# Patient Record
Sex: Male | Born: 1945 | Race: White | Hispanic: No | Marital: Married | Smoking: Former smoker
Health system: Southern US, Community
[De-identification: ages and names within clinical notes are randomized; demographics above are authoritative.]

## PROBLEM LIST (undated history)

## (undated) DIAGNOSIS — I10 Essential (primary) hypertension: Secondary | ICD-10-CM

## (undated) DIAGNOSIS — K219 Gastro-esophageal reflux disease without esophagitis: Secondary | ICD-10-CM

## (undated) DIAGNOSIS — Z87442 Personal history of urinary calculi: Secondary | ICD-10-CM

## (undated) HISTORY — PX: CARPAL TUNNEL RELEASE: SHX101

## (undated) HISTORY — PX: EYE SURGERY: SHX253

## (undated) HISTORY — PX: TONSILLECTOMY: SUR1361

## (undated) HISTORY — PX: BACK SURGERY: SHX140

---

## 2007-09-23 ENCOUNTER — Emergency Department: Payer: Self-pay | Admitting: Emergency Medicine

## 2013-11-17 ENCOUNTER — Observation Stay: Payer: Self-pay | Admitting: Internal Medicine

## 2013-11-17 LAB — CBC WITH DIFFERENTIAL/PLATELET
BASOS ABS: 0 10*3/uL (ref 0.0–0.1)
Basophil %: 0.3 %
Eosinophil #: 0.1 10*3/uL (ref 0.0–0.7)
Eosinophil %: 1.1 %
HCT: 40 % (ref 40.0–52.0)
HGB: 13.5 g/dL (ref 13.0–18.0)
LYMPHS ABS: 1.8 10*3/uL (ref 1.0–3.6)
Lymphocyte %: 26.6 %
MCH: 30.6 pg (ref 26.0–34.0)
MCHC: 33.8 g/dL (ref 32.0–36.0)
MCV: 91 fL (ref 80–100)
Monocyte #: 0.9 x10 3/mm (ref 0.2–1.0)
Monocyte %: 12.7 %
NEUTROS ABS: 4.1 10*3/uL (ref 1.4–6.5)
Neutrophil %: 59.3 %
PLATELETS: 169 10*3/uL (ref 150–440)
RBC: 4.43 10*6/uL (ref 4.40–5.90)
RDW: 13.6 % (ref 11.5–14.5)
WBC: 6.9 10*3/uL (ref 3.8–10.6)

## 2013-11-17 LAB — COMPREHENSIVE METABOLIC PANEL
ALBUMIN: 3.7 g/dL (ref 3.4–5.0)
Alkaline Phosphatase: 111 U/L
Anion Gap: 5 — ABNORMAL LOW (ref 7–16)
BUN: 13 mg/dL (ref 7–18)
Bilirubin,Total: 0.6 mg/dL (ref 0.2–1.0)
CALCIUM: 8.6 mg/dL (ref 8.5–10.1)
CHLORIDE: 106 mmol/L (ref 98–107)
Co2: 29 mmol/L (ref 21–32)
Creatinine: 1.58 mg/dL — ABNORMAL HIGH (ref 0.60–1.30)
EGFR (African American): 52 — ABNORMAL LOW
EGFR (Non-African Amer.): 45 — ABNORMAL LOW
GLUCOSE: 97 mg/dL (ref 65–99)
Osmolality: 279 (ref 275–301)
Potassium: 3.8 mmol/L (ref 3.5–5.1)
SGOT(AST): 56 U/L — ABNORMAL HIGH (ref 15–37)
SGPT (ALT): 66 U/L — ABNORMAL HIGH
Sodium: 140 mmol/L (ref 136–145)
TOTAL PROTEIN: 7.3 g/dL (ref 6.4–8.2)

## 2013-11-18 LAB — BASIC METABOLIC PANEL
Anion Gap: 6 — ABNORMAL LOW (ref 7–16)
BUN: 14 mg/dL (ref 7–18)
CALCIUM: 8 mg/dL — AB (ref 8.5–10.1)
CHLORIDE: 107 mmol/L (ref 98–107)
Co2: 26 mmol/L (ref 21–32)
Creatinine: 1.25 mg/dL (ref 0.60–1.30)
EGFR (African American): >60
EGFR (Non-African Amer.): 59 — ABNORMAL LOW
Glucose: 118 mg/dL — ABNORMAL HIGH (ref 65–99)
OSMOLALITY: 279 (ref 275–301)
POTASSIUM: 4.1 mmol/L (ref 3.5–5.1)
SODIUM: 139 mmol/L (ref 136–145)

## 2013-11-18 LAB — CBC WITH DIFFERENTIAL/PLATELET
Basophil #: 0 10*3/uL (ref 0.0–0.1)
Basophil %: 0.2 %
EOS ABS: 0.1 10*3/uL (ref 0.0–0.7)
Eosinophil %: 1.6 %
HCT: 38.2 % — ABNORMAL LOW (ref 40.0–52.0)
HGB: 13 g/dL (ref 13.0–18.0)
LYMPHS PCT: 24.6 %
Lymphocyte #: 1.7 10*3/uL (ref 1.0–3.6)
MCH: 30.6 pg (ref 26.0–34.0)
MCHC: 33.9 g/dL (ref 32.0–36.0)
MCV: 90 fL (ref 80–100)
Monocyte #: 0.8 x10 3/mm (ref 0.2–1.0)
Monocyte %: 11.9 %
Neutrophil #: 4.4 10*3/uL (ref 1.4–6.5)
Neutrophil %: 61.7 %
PLATELETS: 184 10*3/uL (ref 150–440)
RBC: 4.23 10*6/uL — AB (ref 4.40–5.90)
RDW: 13.4 % (ref 11.5–14.5)
WBC: 7.1 10*3/uL (ref 3.8–10.6)

## 2013-11-22 LAB — CULTURE, BLOOD (SINGLE)

## 2014-07-12 NOTE — Discharge Summary (Signed)
PATIENT NAME:  Dustin EnglishRUDD, Kentaro N MR#:  [id number] DATE OF BIRTH:  1945-08-13  DATE OF ADMISSION:  11/17/2013  DATE OF DISCHARGE:  11/19/2013  CHIEF COMPLAINT ON DATE OF ADMISSION: Right lower extremity redness and rash.   ADMITTING DIAGNOSES: 1.  Acute right lower extremity cellulitis. 2.  Acute renal failure,  3.  Hypertension.   DISCHARGE DIAGNOSES: 1. Acute right lower extremity cellulitis, improved with IV vancomycin. Discharged to home with  doxycycline. 2.  Acute renal insufficiency, probably prerenal, improved with IV fluids.   PROCEDURES: None.   CONSULTATIONS: None.   BRIEF HISTORY OF PRESENT ILLNESS AND HOSPITAL COURSE:   1.  The patient is a 69 year old male, who came into the ED with the chief complaint right lower extremity redness and rash, which has gotten worse the 3 to 4 days prior to the admission. Please review history and physical for details. The patient was admitted to the hospital with right lower extremity cellulitis. The patient failed outpatient therapy with Bactrim and he was started on IV vancomycin. Right lower extremity venous Dopplers were done and DVT was ruled out. With IV vancomycin, his condition significantly improved. Blood cultures were negative. The red streaks tracking up to his groin area were fading away.  The patient started feeling better. Decision is made to discharge the patient home with p.o. doxycycline and probiotics.  2.  Acute renal insufficiency, resolved with IV fluids.  3. Hypertension. Blood pressure is stable. Continue his home medication, atenolol.   CONDITION: Overall condition is stable.   DISCHARGE MEDICATIONS: Atenolol 20 mg once daily, mupirocin 2% topical ointment topically to affected area 3 times a day, Tylenol 325 mg 2 tablets p.o. every 4 hours as needed for pain and temperature, doxycycline 100 mg 1 tablet p.o. 2 times a day for 10 days, probiotic 1 capsule p.o. once daily for 10 days.   DIET: Low-salt.   ACTIVITY: As  tolerated.  Return to work on September 7th.    FOLLOWUP: Follow up with primary care physician in a week.  LABORATORY DATA AND IMAGING STUDIES: Blood cultures are negative x2. Right lower extremity venous Doppler is negative for DVT. WBC 7.1, hemoglobin 13.0, hematocrit is 38.2, platelets are 184. BMP: Sodium, potassium and chloride are normal.  BUN 14, creatinine 1.25 on August 31st.  Calcium is at 8.0, serum osmolality 279.   Plan of care was discussed with the patient. The patient is in agreement with the plan.   TOTAL TIME SPENT ON THE DISCHARGE: Forty-five minutes.    ____________________________ Ramonita LabAruna Merrissa Giacobbe, MD ag:lr D: 11/19/2013 17:23:36 ET T: 11/19/2013 17:48:46 ET JOB#: [id number]  cc: Ramonita LabAruna Suzannah Bettes, MD, <Dictator> Ramonita LabARUNA Chalsey Leeth MD ELECTRONICALLY SIGNED 11/30/2013 15:31 Ramonita LabARUNA Daquana Paddock MD ELECTRONICALLY SIGNED 11/30/2013 16:22

## 2014-07-12 NOTE — H&P (Signed)
PATIENT NAME:  Dustin Reese, [name] MR#:  [id number] DATE OF BIRTH:  1945/09/13  DATE OF ADMISSION:  11/17/2013  PRIMARY CARE [facility]YSICIAN[facility].  CHIEF COMPLAINT: Right lower extremity redness and rash.   HISTORY OF PRESENT ILLNESS: This is a 69 year old male who presents to the hospital with a right lower extremity redness and rash that has progressively gotten worse over the past 3-4 days. The patient says it started out originally with a small lesion and has progressively gotten worse in the past 2 to 3 days. He did say that he had chills and rigors about 2 days ago, but since then no fever. He did not actually record his fever when he had the rigors 2 days back. He also admits to some mild nausea but no acute vomiting. Since his rash was looking worse he actually went to see his primary care physician. They started him on some Bactrim and an ointment. The patient was applying the ointment and taking the Bactrim for the past few days but his rash has not improved. He, therefore, came to the ER for further evaluation. In the Emergency Room, the patient was diagnosed with possible acute right lower extremity cellulitis and the hospitalist services were contacted for further treatment and evaluation.   REVIEW OF SYSTEMS: CONSTITUTIONAL: No documented fever. No weight gain, no weight loss.  EYES: No blurry or double vision.  ENT: No tinnitus. No postnasal drip. No redness of the oropharynx.  RESPIRATORY: No cough. No wheeze. No hemoptysis. No dyspnea.  CARDIOVASCULAR: No chest pain, no orthopnea, no palpitations, no syncope.  GASTROINTESTINAL: Positive nausea. No vomiting. No diarrhea or abdominal pain. No melena or hematochezia.  GENITOURINARY: No dysuria or hematuria.  ENDOCRINE: No polyuria or nocturia. No heat or cold intolerance.  HEMATOLOGIC: No anemia, no bruising, no bleeding.  INTEGUMENTARY: No rashes. No lesions.  MUSCULOSKELETAL: No arthritis, no swelling, no gout.   NEUROLOGIC: No numbness or tingling. No ataxia. No seizure activity.  PSYCHIATRIC: No anxiety. No insomnia. No ADD.   PAST MEDICAL HISTORY: Consistent with hypertension.   ALLERGIES: No known drug allergies.   SOCIAL HISTORY: No smoking. No alcohol abuse. No illicit drug abuse. Lives by himself.   FAMILY HISTORY: Mother and father are both deceased. Mother died at age 69. She was diabetic. Father died at age 69 from a heart attack.   CURRENT MEDICATIONS: As follows: Atenolol 25 mg daily, mupirocin topical 2% cream to be applied 3 times a day and Bactrim double strength 1 tab b.i.d.   PHYSICAL EXAMINATION: Presently is as follows:  VITALS SIGNS: Temperature is 97.8, pulse 56, respirations 18, blood pressure 136/84. Sats 100% on room air.  GENERAL: He is a pleasant-appearing male in no apparent distress.  HEAD, EYES, EARS, NOSE, AND THROAT: Atraumatic, normocephalic. Extraocular muscles are intact. Pupils equal and reactive on to light. Sclerae is anicteric. No conjunctival injection. No pharyngeal erythema.  NECK: Supple. There is no jugular venous distention. No bruits, no lymphadenopathy, no thyromegaly.  HEART: Regular rate and rhythm. No murmurs, no rubs, no clicks.  LUNGS: Clear to auscultation bilaterally. No rales or rhonchi. No wheezes.  ABDOMEN: Soft, flat, nontender, nondistended. Has good bowel sounds. No hepatosplenomegaly appreciated.  EXTREMITIES: No evidence of any cyanosis, clubbing, or peripheral edema. Has +2 pedal and radial pulses bilaterally.  NEUROLOGIC: The patient is alert, awake, and oriented x 3 with no focal motor or sensory deficits appreciated bilaterally.  SKIN: Moist and warm. The patient does have a cellulitic  and angry-appearing rash on his right lower extremity with some blisters with no drainage. It is not warm to touch. It is not painful to touch. He does have a lymphatic streak going up from the rash itself to his right inguinal area but no lymph nodes  palpated.  LYMPHATIC: There is no cervical or axillary lymphadenopathy.   LABORATORY DATA: Serum glucose of 97, BUN 13, creatinine 1.5, sodium 140, potassium 3.8, chloride 106, bicarbonate 29. LFTs showed an AST of 56, ALT of 66. White cell count 6.9, hemoglobin 13.5, hematocrit 40.0, platelet count of 169,000.   ASSESSMENT AND PLAN: This is a 69 year old male with a history of hypertension who presented to the hospital due to right lower extremity redness and rash progressively getting worse treated as an outpatient with oral Bactrim and mupirocin ointment and has failed outpatient therapy.   PROBLEMS: 1. Acute right lower extremity cellulitis. This is a presumed diagnosis, given his clinical symptoms. The patient has failed outpatient oral antibiotic therapy with Bactrim and mupirocin. I will start the patient on IV vancomycin and follow him clinically and follow blood cultures. The cellulitic rash is usually painful and warm to touch, although that is not the case for this patient. He is also afebrile with no white cell count. I will get an ultrasound of the right lower extremity to rule out deep vein thrombosis. If the patient's clinical symptoms are not improving in 24-48 hours, he may benefit from a skin punch biopsy.  2. Hypertension. Continue atenolol. 3. Acute renal failure, likely prerenal in nature. Will hydrate with intravenous fluids and follow BUN and creatinine.   CODE STATUS: The patient is a full code.   TIME SPENT: 50 minutes.   ____________________________ Rolly Pancake. Cherlynn Kaiser, MD vjs:lt D: 11/17/2013 15:44:14 ET T: 11/17/2013 16:51:01 ET JOB#: [id number]  cc: Rolly Pancake. Cherlynn Kaiser, MD, <Dictator> Houston Siren MD ELECTRONICALLY SIGNED 11/27/2013 15:48

## 2017-12-22 ENCOUNTER — Observation Stay
Admission: EM | Admit: 2017-12-22 | Discharge: 2017-12-23 | Disposition: A | Discharge status: Home / Self Care | Payer: Medicare Other | Attending: Internal Medicine | Admitting: Internal Medicine

## 2017-12-22 ENCOUNTER — Other Ambulatory Visit: Payer: Self-pay

## 2017-12-22 ENCOUNTER — Emergency Department: Payer: Medicare Other

## 2017-12-22 DIAGNOSIS — K5730 Diverticulosis of large intestine without perforation or abscess without bleeding: Secondary | ICD-10-CM | POA: Insufficient documentation

## 2017-12-22 DIAGNOSIS — R110 Nausea: Secondary | ICD-10-CM | POA: Diagnosis not present

## 2017-12-22 DIAGNOSIS — N200 Calculus of kidney: Secondary | ICD-10-CM | POA: Diagnosis present

## 2017-12-22 DIAGNOSIS — J449 Chronic obstructive pulmonary disease, unspecified: Secondary | ICD-10-CM | POA: Diagnosis not present

## 2017-12-22 DIAGNOSIS — Z87442 Personal history of urinary calculi: Secondary | ICD-10-CM | POA: Insufficient documentation

## 2017-12-22 DIAGNOSIS — I10 Essential (primary) hypertension: Secondary | ICD-10-CM | POA: Insufficient documentation

## 2017-12-22 DIAGNOSIS — Z79899 Other long term (current) drug therapy: Secondary | ICD-10-CM | POA: Diagnosis not present

## 2017-12-22 DIAGNOSIS — N132 Hydronephrosis with renal and ureteral calculous obstruction: Secondary | ICD-10-CM | POA: Diagnosis not present

## 2017-12-22 DIAGNOSIS — Z87891 Personal history of nicotine dependence: Secondary | ICD-10-CM | POA: Insufficient documentation

## 2017-12-22 DIAGNOSIS — R1031 Right lower quadrant pain: Secondary | ICD-10-CM | POA: Diagnosis not present

## 2017-12-22 DIAGNOSIS — I700 Atherosclerosis of aorta: Secondary | ICD-10-CM | POA: Insufficient documentation

## 2017-12-22 DIAGNOSIS — M419 Scoliosis, unspecified: Secondary | ICD-10-CM | POA: Diagnosis not present

## 2017-12-22 DIAGNOSIS — N201 Calculus of ureter: Secondary | ICD-10-CM

## 2017-12-22 HISTORY — DX: Essential (primary) hypertension: I10

## 2017-12-22 LAB — COMPREHENSIVE METABOLIC PANEL
ALBUMIN: 4.5 g/dL (ref 3.5–5.0)
ALK PHOS: 137 U/L — AB (ref 38–126)
ALT: 29 U/L (ref 0–44)
AST: 34 U/L (ref 15–41)
Anion gap: 8 (ref 5–15)
BILIRUBIN TOTAL: 0.9 mg/dL (ref 0.3–1.2)
BUN: 22 mg/dL (ref 8–23)
CALCIUM: 9.2 mg/dL (ref 8.9–10.3)
CO2: 27 mmol/L (ref 22–32)
Chloride: 108 mmol/L (ref 98–111)
Creatinine, Ser: 1.6 mg/dL — ABNORMAL HIGH (ref 0.61–1.24)
GFR calc Af Amer: 48 mL/min — ABNORMAL LOW (ref >60)
GFR calc non Af Amer: 42 mL/min — ABNORMAL LOW (ref >60)
GLUCOSE: 139 mg/dL — AB (ref 70–99)
Potassium: 4.1 mmol/L (ref 3.5–5.1)
SODIUM: 143 mmol/L (ref 135–145)
TOTAL PROTEIN: 7.5 g/dL (ref 6.5–8.1)

## 2017-12-22 LAB — URINALYSIS, COMPLETE (UACMP) WITH MICROSCOPIC
BACTERIA UA: NONE SEEN
BILIRUBIN URINE: NEGATIVE
Glucose, UA: NEGATIVE mg/dL
KETONES UR: NEGATIVE mg/dL
Leukocytes, UA: NEGATIVE
Nitrite: NEGATIVE
PH: 7 (ref 5.0–8.0)
Protein, ur: NEGATIVE mg/dL
SQUAMOUS EPITHELIAL / LPF: NONE SEEN (ref 0–5)
Specific Gravity, Urine: 1.015 (ref 1.005–1.030)

## 2017-12-22 LAB — CBC WITH DIFFERENTIAL/PLATELET
Basophils Absolute: 0 10*3/uL (ref 0–0.1)
Basophils Relative: 0 %
Eosinophils Absolute: 0.2 10*3/uL (ref 0–0.7)
Eosinophils Relative: 2 %
HEMATOCRIT: 42.4 % (ref 40.0–52.0)
Hemoglobin: 15.2 g/dL (ref 13.0–18.0)
LYMPHS PCT: 31 %
Lymphs Abs: 2.9 10*3/uL (ref 1.0–3.6)
MCH: 31.7 pg (ref 26.0–34.0)
MCHC: 35.9 g/dL (ref 32.0–36.0)
MCV: 88.1 fL (ref 80.0–100.0)
MONO ABS: 1 10*3/uL (ref 0.2–1.0)
MONOS PCT: 10 %
NEUTROS ABS: 5.4 10*3/uL (ref 1.4–6.5)
Neutrophils Relative %: 57 %
Platelets: 190 10*3/uL (ref 150–440)
RBC: 4.81 MIL/uL (ref 4.40–5.90)
RDW: 13.7 % (ref 11.5–14.5)
WBC: 9.6 10*3/uL (ref 3.8–10.6)

## 2017-12-22 MED ORDER — PROMETHAZINE HCL 25 MG/ML IJ SOLN
12.5000 mg | Freq: Once | INTRAMUSCULAR | Status: AC
  Administered 2017-12-22: 12.5 mg via INTRAVENOUS
  Filled 2017-12-22: qty 1

## 2017-12-22 MED ORDER — ONDANSETRON HCL 4 MG/2ML IJ SOLN
INTRAMUSCULAR | Status: AC
  Administered 2017-12-22: 4 mg via INTRAVENOUS
  Filled 2017-12-22: qty 2

## 2017-12-22 MED ORDER — POLYETHYLENE GLYCOL 3350 17 G PO PACK: 17.0000 g | PACK | Freq: Every day | ORAL | Status: DC | PRN

## 2017-12-22 MED ORDER — MORPHINE SULFATE (PF) 4 MG/ML IV SOLN
INTRAVENOUS | Status: AC
  Administered 2017-12-22: 4 mg via INTRAVENOUS
  Filled 2017-12-22: qty 1

## 2017-12-22 MED ORDER — OXYCODONE-ACETAMINOPHEN 10-325 MG PO TABS: 1.0000 | ORAL_TABLET | Freq: Four times a day (QID) | ORAL | 0 refills | Status: DC | PRN

## 2017-12-22 MED ORDER — TAMSULOSIN HCL 0.4 MG PO CAPS
0.4000 mg | ORAL_CAPSULE | Freq: Every day | ORAL | Status: DC
  Administered 2017-12-23: 0.4 mg via ORAL
  Filled 2017-12-22: qty 1

## 2017-12-22 MED ORDER — HYDROCODONE-ACETAMINOPHEN 5-325 MG PO TABS
1.0000 | ORAL_TABLET | ORAL | Status: DC | PRN
  Administered 2017-12-22: 1 via ORAL
  Administered 2017-12-22: 2 via ORAL
  Filled 2017-12-22: qty 2
  Filled 2017-12-22: qty 1
  Filled 2017-12-22: qty 2

## 2017-12-22 MED ORDER — HEPARIN SODIUM (PORCINE) 5000 UNIT/ML IJ SOLN
5000.0000 [IU] | Freq: Three times a day (TID) | INTRAMUSCULAR | Status: DC
  Administered 2017-12-22: 5000 [IU] via SUBCUTANEOUS
  Filled 2017-12-22 (×3): qty 1

## 2017-12-22 MED ORDER — TEMAZEPAM 15 MG PO CAPS
15.0000 mg | ORAL_CAPSULE | Freq: Every day | ORAL | Status: DC
  Administered 2017-12-22: 15 mg via ORAL
  Filled 2017-12-22: qty 1

## 2017-12-22 MED ORDER — ACETAMINOPHEN 325 MG PO TABS: 650.0000 mg | ORAL_TABLET | Freq: Four times a day (QID) | ORAL | Status: DC | PRN

## 2017-12-22 MED ORDER — KETOROLAC TROMETHAMINE 30 MG/ML IJ SOLN
15.0000 mg | Freq: Four times a day (QID) | INTRAMUSCULAR | Status: DC | PRN
  Administered 2017-12-23 (×2): 15 mg via INTRAVENOUS
  Filled 2017-12-22 (×2): qty 1

## 2017-12-22 MED ORDER — HYDROMORPHONE HCL 1 MG/ML IJ SOLN
INTRAMUSCULAR | Status: AC
  Administered 2017-12-22: 1 mg via INTRAVENOUS
  Filled 2017-12-22: qty 1

## 2017-12-22 MED ORDER — KETOROLAC TROMETHAMINE 30 MG/ML IJ SOLN
30.0000 mg | Freq: Once | INTRAMUSCULAR | Status: AC
  Administered 2017-12-22: 30 mg via INTRAVENOUS
  Filled 2017-12-22: qty 1

## 2017-12-22 MED ORDER — ONDANSETRON 4 MG PO TBDP: 4.0000 mg | ORAL_TABLET | Freq: Three times a day (TID) | ORAL | 0 refills | Status: DC | PRN

## 2017-12-22 MED ORDER — MORPHINE SULFATE (PF) 4 MG/ML IV SOLN
4.0000 mg | Freq: Once | INTRAVENOUS | Status: AC
  Administered 2017-12-22: 4 mg via INTRAVENOUS
  Filled 2017-12-22: qty 1

## 2017-12-22 MED ORDER — ATENOLOL 25 MG PO TABS
25.0000 mg | ORAL_TABLET | Freq: Every day | ORAL | Status: DC
  Administered 2017-12-22 – 2017-12-23 (×2): 25 mg via ORAL
  Filled 2017-12-22 (×2): qty 1

## 2017-12-22 MED ORDER — METOCLOPRAMIDE HCL 5 MG/ML IJ SOLN
10.0000 mg | Freq: Once | INTRAMUSCULAR | Status: AC
  Administered 2017-12-22: 10 mg via INTRAVENOUS
  Filled 2017-12-22: qty 2

## 2017-12-22 MED ORDER — MORPHINE SULFATE (PF) 4 MG/ML IV SOLN
4.0000 mg | Freq: Once | INTRAVENOUS | Status: AC
  Administered 2017-12-22: 4 mg via INTRAVENOUS

## 2017-12-22 MED ORDER — SODIUM CHLORIDE 0.9 % IV SOLN
INTRAVENOUS | Status: DC
  Administered 2017-12-22 – 2017-12-23 (×3): via INTRAVENOUS

## 2017-12-22 MED ORDER — ACETAMINOPHEN 650 MG RE SUPP: 650.0000 mg | Freq: Four times a day (QID) | RECTAL | Status: DC | PRN

## 2017-12-22 MED ORDER — HYDROMORPHONE HCL 1 MG/ML IJ SOLN
1.0000 mg | Freq: Once | INTRAMUSCULAR | Status: AC
  Administered 2017-12-22: 1 mg via INTRAVENOUS

## 2017-12-22 MED ORDER — HYDRALAZINE HCL 20 MG/ML IJ SOLN
10.0000 mg | Freq: Four times a day (QID) | INTRAMUSCULAR | Status: DC | PRN
  Administered 2017-12-22: 10 mg via INTRAVENOUS
  Filled 2017-12-22: qty 1

## 2017-12-22 MED ORDER — TAMSULOSIN HCL 0.4 MG PO CAPS
0.4000 mg | ORAL_CAPSULE | Freq: Once | ORAL | Status: AC
  Administered 2017-12-22: 0.4 mg via ORAL
  Filled 2017-12-22: qty 1

## 2017-12-22 MED ORDER — SODIUM CHLORIDE 0.9 % IV BOLUS
1000.0000 mL | Freq: Once | INTRAVENOUS | Status: AC
  Administered 2017-12-22: 1000 mL via INTRAVENOUS

## 2017-12-22 MED ORDER — ONDANSETRON HCL 4 MG/2ML IJ SOLN
4.0000 mg | Freq: Once | INTRAMUSCULAR | Status: AC
  Administered 2017-12-22: 4 mg via INTRAVENOUS

## 2017-12-22 MED ORDER — TAMSULOSIN HCL 0.4 MG PO CAPS: 0.4000 mg | ORAL_CAPSULE | Freq: Every day | ORAL | 0 refills | Status: DC

## 2017-12-22 NOTE — ED Notes (Signed)
Paged Dr. Allena Katz who returned call to this RN. Informed of urologist recommendations. Pt scared to go home d/t pain. Pt is urinating on own and eating at this time. Dr. Allena Katz informed. States continue with admission process.

## 2017-12-22 NOTE — ED Notes (Signed)
Urologist, Dr. Mena Goes, called this RN stating that pt does not need stent placement. Believes pt could go home if feeling better or have observation with pain control over night and have procedure tomorrow. Informed this RN to update admitting MD, Dr. Allena Katz.

## 2017-12-22 NOTE — ED Notes (Signed)
Pt ambulatory to toilet by self. Denies dizziness.

## 2017-12-22 NOTE — ED Provider Notes (Signed)
[facility] Emergency Department Provider Note    First MD Initiated Contact with Patient 12/22/17 [phone]     (approximate)  I have reviewed the triage vital signs and the nursing notes.   HISTORY  Chief Complaint Flank Pain    HPI Dustin Reese is a 72 y.o. male resents to the emergency department with acute onset of right flank pain which began yesterday with acute worsening tonight.  Patient also admits to nausea.  Patient denies any fever hematuria.  Patient denies any diarrhea or constipation.  Patient states current pain score is 10 out of 10.     Past medical history Kidney stone There are no active problems to display for this patient.     Prior to Admission medications   Medication Sig Start Date End Date Taking? Authorizing Provider  atenolol (TENORMIN) 25 MG tablet Take 25 mg by mouth daily. 09/28/17   [provider]  ondansetron (ZOFRAN ODT) 4 MG disintegrating tablet Take 1 tablet (4 mg total) by mouth every 8 (eight) hours as needed for nausea or vomiting. 12/22/17   Darci Current, MD  oxyCODONE-acetaminophen (PERCOCET) 10-325 MG tablet Take 1 tablet by mouth every 6 (six) hours as needed for pain. 12/22/17 12/22/18  Darci Current, MD  tamsulosin (FLOMAX) 0.4 MG CAPS capsule Take 1 capsule (0.4 mg total) by mouth daily after breakfast for 7 days. 12/22/17 12/29/17  Darci Current, MD  temazepam (RESTORIL) 15 MG capsule Take 15 mg by mouth at bedtime. 12/18/17   [provider]    Allergies No known drug allergies History reviewed. No pertinent family history.  Social History Social History   Tobacco Use  . Smoking status: Not on file  Substance Use Topics  . Alcohol use: Not on file  . Drug use: Not on file    Review of Systems Constitutional: No fever/chills Eyes: No visual changes. ENT: No sore throat. Cardiovascular: Denies chest pain. Respiratory: Denies shortness of breath. Gastrointestinal:  Positive for right flank pain and nausea.  No diarrhea.  No constipation. Genitourinary: Negative for dysuria. Musculoskeletal: Negative for neck pain.  Negative for back pain. Integumentary: Negative for rash. Neurological: Negative for headaches, focal weakness or numbness.   ____________________________________________   PHYSICAL EXAM:  VITAL SIGNS: ED Triage Vitals  Enc Vitals Group     BP 12/22/17 0424 (!) 140/99     Pulse Rate 12/22/17 0424 77     Resp 12/22/17 0424 18     Temp 12/22/17 0424 97.6 F (36.4 C)     Temp Source 12/22/17 0424 Oral     SpO2 12/22/17 0424 100 %     Weight --      Height --      Head Circumference --      Peak Flow --      Pain Score 12/22/17 0414 10     Pain Loc --      Pain Edu? --      Excl. in GC? --     Constitutional: Alert and oriented.  Apparent discomfort Eyes: Conjunctivae are normal.  Mouth/Throat: Mucous membranes are moist.  Oropharynx non-erythematous. Neck: No stridor.   Cardiovascular: Normal rate, regular rhythm. Good peripheral circulation. Grossly normal heart sounds. Respiratory: Normal respiratory effort.  No retractions. Lungs CTAB. Gastrointestinal: Soft and nontender. No distention.  Musculoskeletal: No lower extremity tenderness nor edema. No gross deformities of extremities. Neurologic:  Normal speech and language. No gross focal neurologic deficits are appreciated.  Skin:  Skin is warm, dry and intact. No rash noted. Psychiatric: Mood and affect are normal. Speech and behavior are normal.  ____________________________________________   LABS (all labs ordered are listed, but only abnormal results are displayed)  Labs Reviewed  COMPREHENSIVE METABOLIC PANEL - Abnormal; Notable for the following components:      Result Value   Glucose, Bld 139 (*)    Creatinine, Ser 1.60 (*)    Alkaline Phosphatase 137 (*)    GFR calc non Af Amer 42 (*)    GFR calc Af Amer 48 (*)    All other components within normal  limits  URINALYSIS, COMPLETE (UACMP) WITH MICROSCOPIC - Abnormal; Notable for the following components:   Color, Urine YELLOW (*)    APPearance CLEAR (*)    Hgb urine dipstick MODERATE (*)    All other components within normal limits  CBC WITH DIFFERENTIAL/PLATELET   _______________  RADIOLOGY I, Buford Dewayne Shorter, personally viewed and evaluated these images (plain radiographs) as part of my medical decision making, as well as reviewing the written report by the radiologist.  ED MD interpretation: 5 mm obstructing right urethral stone with associated hydronephrosis.  Official radiology report(s): Ct Renal Stone Study  Result Date: 12/22/2017 CLINICAL DATA:  Right flank pain.  History of kidney stones. EXAM: CT ABDOMEN AND PELVIS WITHOUT CONTRAST TECHNIQUE: Multidetector CT imaging of the abdomen and pelvis was performed following the standard protocol without IV contrast. COMPARISON:  CT 09/23/2007 FINDINGS: Lower chest: Unchanged clustered nodules in the right middle lobe, stability over 10 years consistent with benign process. Hepatobiliary: No focal liver abnormality is seen. No gallstones, gallbladder wall thickening, or biliary dilatation. Pancreas: No ductal dilatation or inflammation. Spleen: Normal in size without focal abnormality. Adrenals/Urinary Tract: Normal adrenal glands. Obstructing 5 x 5 mm stone in the right mid ureter with moderate proximal hydroureteronephrosis and perinephric edema. 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 Two nonobstructing stones in the left kidney. No left hydronephrosis. Left ureter is decompressed. Urinary bladder is physiologically distended. No bladder wall thickening. No bladder stone. Stomach/Bowel: Moderate diverticulosis of the descending colon. No diverticulitis. No bowel wall thickening, inflammatory change or obstruction. Normal appendix. Stomach is nondistended. Vascular/Lymphatic: Aortic atherosclerosis and  tortuosity. No aneurysm. No enlarged abdominal or pelvic lymph nodes. Reproductive: Prostate is unremarkable. Other: Multiple pelvic phleboliths.  No free air or free fluid. Musculoskeletal: Scoliosis and degenerative change in the lumbar spine. Modic endplate changes at L3-L4 and L4-L5. There are no acute or suspicious osseous abnormalities. IMPRESSION: 1. Obstructing 5 mm stone in the right mid ureter with moderate hydronephrosis. 2. Bilateral nonobstructing nephrolithiasis. 3.  Aortic Atherosclerosis (ICD10-I70.0). Electronically Signed   By: Narda Rutherford M.D.   On: 12/22/2017 05:15     Procedures   ____________________________________________   INITIAL IMPRESSION / ASSESSMENT AND PLAN / ED COURSE  As part of my medical decision making, I reviewed the following data within the electronic MEDICAL RECORD NUMBER   72 year old male presenting with above-stated history and physical exam secondary to right flank pain.  Concern for possible kidney stone as such CT was performed revealed a 5 mm right ureteral stone with hydronephrosis.  Patient's urinalysis revealed no evidence of UTI.  Patient received IV morphine with minimal pain improvement and as such patient was given Dilaudid pain is controlled at this time.  Patient given Zofran however nausea persists and as such she received Reglan and Phenergan.  Patient will be referred to urology  for further outpatient evaluation following pain control and nausea control ____________________________________________  FINAL CLINICAL IMPRESSION(S) / ED DIAGNOSES  Final diagnoses:  Right kidney stone     MEDICATIONS GIVEN DURING THIS VISIT:  Medications  ketorolac (TORADOL) 30 MG/ML injection 30 mg (has no administration in time range)  tamsulosin (FLOMAX) capsule 0.4 mg (has no administration in time range)  morphine 4 MG/ML injection 4 mg (4 mg Intravenous Given 12/22/17 0423)  ondansetron (ZOFRAN) injection 4 mg (4 mg Intravenous Given 12/22/17  0423)  sodium chloride 0.9 % bolus 1,000 mL (0 mLs Intravenous Stopped 12/22/17 0606)  morphine 4 MG/ML injection 4 mg (4 mg Intravenous Given 12/22/17 0427)  HYDROmorphone (DILAUDID) injection 1 mg (1 mg Intravenous Given 12/22/17 0434)  ondansetron (ZOFRAN) injection 4 mg (4 mg Intravenous Given 12/22/17 0503)  metoCLOPramide (REGLAN) injection 10 mg (10 mg Intravenous Given 12/22/17 0539)  promethazine (PHENERGAN) injection 12.5 mg (12.5 mg Intravenous Given 12/22/17 0643)     ED Discharge Orders         Ordered    oxyCODONE-acetaminophen (PERCOCET) 10-325 MG tablet  Every 6 hours PRN     12/22/17 0652    ondansetron (ZOFRAN ODT) 4 MG disintegrating tablet  Every 8 hours PRN     12/22/17 0652    tamsulosin (FLOMAX) 0.4 MG CAPS capsule  Daily after breakfast     12/22/17 4098           Note:  This document was prepared using Dragon voice recognition software and may include unintentional dictation errors.    Darci Current, MD 12/22/17 0700

## 2017-12-22 NOTE — Progress Notes (Signed)
Family Meeting Note  Patient being admitted with right made ureteral stone and bilateral nephrology passes not obstructing. He has history of hypertension. Otherwise quite healthy and functional. Code status discussed patient wants to be full code. No family in the room. Time spent during discussion: 16 mins  Enedina Finner, MD

## 2017-12-22 NOTE — Consult Note (Signed)
Consult patient: Right ureteral stone Requested by: Dr. Bayard Males  History of Present Illness: 72 year old white male who developed right flank pain yesterday and was so severe he came to emergency last night.  His creatinine bumped slightly to 1.6.  No white count.  UA showed no bacteria, 0-5 white cells and 6-10 red cells.  He continues to have significant pain tonight in the right flank.  He has been voiding and straining the urine but has not seen a stone pass.  He has some nausea.  He passed a stone several years ago but has never needed surgery for stones.  There is also a 5 mm stone in the right midpole on CT.  Past Medical History:  Diagnosis Date  . HTN (hypertension)      Home Medications:  Medications Prior to Admission  Medication Sig Dispense Refill Last Dose  . atenolol (TENORMIN) 25 MG tablet Take 25 mg by mouth daily.  0 12/21/2017 at AM  . temazepam (RESTORIL) 15 MG capsule Take 15 mg by mouth at bedtime.  2 12/21/2017 at PM   Allergies: No Known Allergies  History reviewed. No pertinent family history. Social History:  has no tobacco, alcohol, and drug history on file.  ROS: A complete review of systems was performed.  All systems are negative except for pertinent findings as noted. Review of Systems  All other systems reviewed and are negative.    Physical Exam:  Vital signs in last 24 hours: Temp:  [97.6 F (36.4 C)-98.8 F (37.1 C)] 98 F (36.7 C) (10/04 1937) Pulse Rate:  [64-83] 79 (10/04 1937) Resp:  [12-18] 16 (10/04 1937) BP: (124-186)/(69-101) 186/101 (10/04 1937) SpO2:  [91 %-100 %] 100 % (10/04 1937) General:  Alert and oriented, No acute distress, sitting on the edge of the bed.  Looks a little bit uncomfortable. HEENT: Normocephalic, atraumatic Cardiovascular: Regular rate and rhythm Lungs: Regular rate and effort Abdomen: Soft, nontender, nondistended, no abdominal masses Back: No CVA tenderness Extremities: No edema Neurologic:  Grossly intact  Laboratory Data:  Results for orders placed or performed during the hospital encounter of 12/22/17 (from the past 24 hour(s))  CBC with Differential     Status: None   Collection Time: 12/22/17  4:18 AM  Result Value Ref Range   WBC 9.6 3.8 - 10.6 K/uL   RBC 4.81 4.40 - 5.90 MIL/uL   Hemoglobin 15.2 13.0 - 18.0 g/dL   HCT 46.9 62.9 - 52.8 %   MCV 88.1 80.0 - 100.0 fL   MCH 31.7 26.0 - 34.0 pg   MCHC 35.9 32.0 - 36.0 g/dL   RDW 41.3 24.4 - 01.0 %   Platelets 190 150 - 440 K/uL   Neutrophils Relative % 57 %   Neutro Abs 5.4 1.4 - 6.5 K/uL   Lymphocytes Relative 31 %   Lymphs Abs 2.9 1.0 - 3.6 K/uL   Monocytes Relative 10 %   Monocytes Absolute 1.0 0.2 - 1.0 K/uL   Eosinophils Relative 2 %   Eosinophils Absolute 0.2 0 - 0.7 K/uL   Basophils Relative 0 %   Basophils Absolute 0.0 0 - 0.1 K/uL  Comprehensive metabolic panel     Status: Abnormal   Collection Time: 12/22/17  4:18 AM  Result Value Ref Range   Sodium 143 135 - 145 mmol/L   Potassium 4.1 3.5 - 5.1 mmol/L   Chloride 108 98 - 111 mmol/L   CO2 27 22 - 32 mmol/L   Glucose, Bld 139 (  H) 70 - 99 mg/dL   BUN 22 8 - 23 mg/dL   Creatinine, Ser 1.61 (H) 0.61 - 1.24 mg/dL   Calcium 9.2 8.9 - 09.6 mg/dL   Total Protein 7.5 6.5 - 8.1 g/dL   Albumin 4.5 3.5 - 5.0 g/dL   AST 34 15 - 41 U/L   ALT 29 0 - 44 U/L   Alkaline Phosphatase 137 (H) 38 - 126 U/L   Total Bilirubin 0.9 0.3 - 1.2 mg/dL   GFR calc non Af Amer 42 (L) >60 mL/min   GFR calc Af Amer 48 (L) >60 mL/min   Anion gap 8 5 - 15  Urinalysis, Complete w Microscopic     Status: Abnormal   Collection Time: 12/22/17  6:06 AM  Result Value Ref Range   Color, Urine YELLOW (A) YELLOW   APPearance CLEAR (A) CLEAR   Specific Gravity, Urine 1.015 1.005 - 1.030   pH 7.0 5.0 - 8.0   Glucose, UA NEGATIVE NEGATIVE mg/dL   Hgb urine dipstick MODERATE (A) NEGATIVE   Bilirubin Urine NEGATIVE NEGATIVE   Ketones, ur NEGATIVE NEGATIVE mg/dL   Protein, ur NEGATIVE  NEGATIVE mg/dL   Nitrite NEGATIVE NEGATIVE   Leukocytes, UA NEGATIVE NEGATIVE   RBC / HPF 6-10 0 - 5 RBC/hpf   WBC, UA 0-5 0 - 5 WBC/hpf   Bacteria, UA NONE SEEN NONE SEEN   Squamous Epithelial / LPF NONE SEEN 0 - 5   Mucus PRESENT    No results found for this or any previous visit (from the past 240 hour(s)). Creatinine: Recent Labs    12/22/17 0418  CREATININE 1.60*    Impression/Assessment:  Right renal and right ureteral stone- Plan:  I discussed with the patient the nature, potential benefits, risks and alternatives to cystoscopy, right retrograde, right ureteral stent with ureteroscopy possible laser lithotripsy, including side effects of the proposed treatment, the likelihood of the patient achieving the goals of the procedure, and any potential problems that might occur during the procedure or recuperation. Also discussed continued stone passage and f/u in outpatient to consider ESWL. I drew patient a picture of the anatomy. All questions answered. We'll see how he does overnight. If he continues to have pain we'll take him to OR tomorrow. NPO p MN.     Jerilee Field 12/22/2017, 8:24 PM

## 2017-12-22 NOTE — ED Notes (Signed)
Pt sitting up eating lunch tray

## 2017-12-22 NOTE — ED Notes (Signed)
Dr. Brown at bedside

## 2017-12-22 NOTE — ED Notes (Signed)
Pt transported to  203 

## 2017-12-22 NOTE — H&P (Addendum)
[facility] - [place] at [facility]   PATIENT NAME: Dustin Reese    MR#:  [id number]  DATE OF BIRTH:  12-Jan-1946  DATE OF ADMISSION:  12/22/2017  PRIMARY CARE PHYSICIAN: System, Pcp Not In   REQUESTING/REFERRING PHYSICIAN: Dr. Manson Passey  CHIEF COMPLAINT:  abdominal pain nausea and vomiting  HISTORY OF PRESENT ILLNESS:  Dustin Reese  is a 72 y.o. male with a known history of hypertension and a kidney stone about 10 years ago comes to the emergency room with significant back pain nausea and vomiting. Patient was found to have bright rights ureteral stone about 5 mm along with hydronephrosis. ER physician spoke with Dr. Mena Goes. Patient is being admitted for further evaluation of management. Patient feels his pain is under control. He is a bit anxious about the stone   PAST MEDICAL HISTORY:   Past Medical History:  Diagnosis Date  . HTN (hypertension)     PAST SURGICAL HISTOIRY:    SOCIAL HISTORY:   Social History   Tobacco Use  . Smoking status: Not on file  Substance Use Topics  . Alcohol use: Not on file    FAMILY HISTORY:  History reviewed. No pertinent family history.  DRUG ALLERGIES:  No Known Allergies  REVIEW OF SYSTEMS:  Review of Systems  Constitutional: Negative for chills, fever and weight loss.  HENT: Negative for ear discharge, ear pain and nosebleeds.   Eyes: Negative for blurred vision, pain and discharge.  Respiratory: Negative for sputum production, shortness of breath, wheezing and stridor.   Cardiovascular: Negative for chest pain, palpitations, orthopnea and PND.  Gastrointestinal: Positive for abdominal pain, nausea and vomiting. Negative for diarrhea.  Genitourinary: Negative for frequency and urgency.  Musculoskeletal: Positive for back pain. Negative for joint pain.  Neurological: Negative for sensory change, speech change, focal weakness and weakness.  Psychiatric/Behavioral: Negative for depression and hallucinations.  The patient is not nervous/anxious.      MEDICATIONS AT HOME:   Prior to Admission medications   Medication Sig Start Date End Date Taking? Authorizing Provider  atenolol (TENORMIN) 25 MG tablet Take 25 mg by mouth daily. 09/28/17  Yes [provider]  temazepam (RESTORIL) 15 MG capsule Take 15 mg by mouth at bedtime. 12/18/17  Yes [provider]  ondansetron (ZOFRAN ODT) 4 MG disintegrating tablet Take 1 tablet (4 mg total) by mouth every 8 (eight) hours as needed for nausea or vomiting. 12/22/17   Darci Current, MD  oxyCODONE-acetaminophen (PERCOCET) 10-325 MG tablet Take 1 tablet by mouth every 6 (six) hours as needed for pain. 12/22/17 12/22/18  Darci Current, MD  tamsulosin (FLOMAX) 0.4 MG CAPS capsule Take 1 capsule (0.4 mg total) by mouth daily after breakfast for 7 days. 12/22/17 12/29/17  Darci Current, MD      VITAL SIGNS:  Blood pressure (!) 174/69, pulse 83, temperature 98.8 F (37.1 C), temperature source Oral, resp. rate 12, SpO2 97 %.  PHYSICAL EXAMINATION:  GENERAL:  72 y.o.-year-old patient lying in the bed with no acute distress.  EYES: Pupils equal, round, reactive to light and accommodation. No scleral icterus. Extraocular muscles intact.  HEENT: Head atraumatic, normocephalic. Oropharynx and nasopharynx clear.  NECK:  Supple, no jugular venous distention. No thyroid enlargement, no tenderness.  LUNGS: Normal breath sounds bilaterally, no wheezing, rales,rhonchi or crepitation. No use of accessory muscles of respiration.  CARDIOVASCULAR: S1, S2 normal. No murmurs, rubs, or gallops.  ABDOMEN: Soft, nontender, nondistended. Bowel sounds present. No organomegaly or  mass.  EXTREMITIES: No pedal edema, cyanosis, or clubbing.  NEUROLOGIC: Cranial nerves II through XII are intact. Muscle strength 5/5 in all extremities. Sensation intact. Gait not checked.  PSYCHIATRIC: The patient is alert and oriented x 3.  SKIN: No obvious rash, lesion, or ulcer.    LABORATORY PANEL:   CBC Recent Labs  Lab 12/22/17 0418  WBC 9.6  HGB 15.2  HCT 42.4  PLT 190   ------------------------------------------------------------------------------------------------------------------  Chemistries  Recent Labs  Lab 12/22/17 0418  NA 143  K 4.1  CL 108  CO2 27  GLUCOSE 139*  BUN 22  CREATININE 1.60*  CALCIUM 9.2  AST 34  ALT 29  ALKPHOS 137*  BILITOT 0.9   ------------------------------------------------------------------------------------------------------------------  Cardiac Enzymes No results for input(s): TROPONINI in the last 168 hours. ------------------------------------------------------------------------------------------------------------------  RADIOLOGY:  Ct Renal Stone Study  Result Date: 12/22/2017 CLINICAL DATA:  Right flank pain.  History of kidney stones. EXAM: CT ABDOMEN AND PELVIS WITHOUT CONTRAST TECHNIQUE: Multidetector CT imaging of the abdomen and pelvis was performed following the standard protocol without IV contrast. COMPARISON:  CT 09/23/2007 FINDINGS: Lower chest: Unchanged clustered nodules in the right middle lobe, stability over 10 years consistent with benign process. Hepatobiliary: No focal liver abnormality is seen. No gallstones, gallbladder wall thickening, or biliary dilatation. Pancreas: No ductal dilatation or inflammation. Spleen: Normal in size without focal abnormality. Adrenals/Urinary Tract: Normal adrenal glands. Obstructing 5 x 5 mm stone in the right mid ureter with moderate proximal hydroureteronephrosis and perinephric edema. 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 Two nonobstructing stones in the left kidney. No left hydronephrosis. Left ureter is decompressed. Urinary bladder is physiologically distended. No bladder wall thickening. No bladder stone. Stomach/Bowel: Moderate diverticulosis of the descending colon. No diverticulitis. No bowel wall thickening,  inflammatory change or obstruction. Normal appendix. Stomach is nondistended. Vascular/Lymphatic: Aortic atherosclerosis and tortuosity. No aneurysm. No enlarged abdominal or pelvic lymph nodes. Reproductive: Prostate is unremarkable. Other: Multiple pelvic phleboliths.  No free air or free fluid. Musculoskeletal: Scoliosis and degenerative change in the lumbar spine. Modic endplate changes at L3-L4 and L4-L5. There are no acute or suspicious osseous abnormalities. IMPRESSION: 1. Obstructing 5 mm stone in the right mid ureter with moderate hydronephrosis. 2. Bilateral nonobstructing nephrolithiasis. 3.  Aortic Atherosclerosis (ICD10-I70.0). Electronically Signed   By: Narda Rutherford M.D.   On: 12/22/2017 05:15    EKG:    IMPRESSION AND PLAN:   Dustin Reese  is a 72 y.o. male with a known history of hypertension and a kidney stone about 10 years ago comes to the emergency room with significant back pain nausea and vomiting. Patient was found to have bright rights ureteral stone about 5 mm along with hydronephrosis. ER physician spoke with Dr. Mena Goes. Patient is being admitted for further evaluation of management.  1. Ureterolithiasis in the  right mid ureteral with moderate hydronephrosis -admit to medical floor -IV fluids -urology consultation -Flomax daily -strain urine -PRN pain meds  2. hypertension continue atenolol  3. DVT prophylaxis subcu Lovenox  All the records are reviewed and case discussed with ED provider. Management plans discussed with the patient, family and they are in agreement.  CODE STATUS: full  TOTAL TIME TAKING CARE OF THIS PATIENT: *45* minutes.    Enedina Finner M.D on 12/22/2017 at 2:23 PM  Between 7am to 6pm - Pager - [phone]  After 6pm go to [email or web address] - password [email or web address] [facility]  [facility]  Office  [phone]  CC: Primary care physician; System, Pcp Not In

## 2017-12-22 NOTE — ED Triage Notes (Signed)
Patient to ER for c/o right flank pain with radiating pain to right lower quadrant abd since yesterday. Patient has h/o kidney stones. Patient visibly uncomfortable.

## 2017-12-23 ENCOUNTER — Encounter
Admission: EM | Disposition: A | Discharge status: Home / Self Care | Payer: Self-pay | Source: Home / Self Care | Attending: Emergency Medicine

## 2017-12-23 ENCOUNTER — Inpatient Hospital Stay: Payer: Medicare Other | Admitting: Anesthesiology

## 2017-12-23 ENCOUNTER — Inpatient Hospital Stay: Payer: Medicare Other

## 2017-12-23 DIAGNOSIS — N132 Hydronephrosis with renal and ureteral calculous obstruction: Secondary | ICD-10-CM

## 2017-12-23 DIAGNOSIS — N200 Calculus of kidney: Secondary | ICD-10-CM

## 2017-12-23 HISTORY — PX: URETEROSCOPY WITH HOLMIUM LASER LITHOTRIPSY: SHX6645

## 2017-12-23 LAB — BASIC METABOLIC PANEL
Anion gap: 6 (ref 5–15)
BUN: 25 mg/dL — ABNORMAL HIGH (ref 8–23)
CHLORIDE: 108 mmol/L (ref 98–111)
CO2: 25 mmol/L (ref 22–32)
CREATININE: 2.16 mg/dL — AB (ref 0.61–1.24)
Calcium: 8.2 mg/dL — ABNORMAL LOW (ref 8.9–10.3)
GFR calc Af Amer: 34 mL/min — ABNORMAL LOW (ref >60)
GFR calc non Af Amer: 29 mL/min — ABNORMAL LOW (ref >60)
Glucose, Bld: 128 mg/dL — ABNORMAL HIGH (ref 70–99)
POTASSIUM: 4.2 mmol/L (ref 3.5–5.1)
SODIUM: 139 mmol/L (ref 135–145)

## 2017-12-23 SURGERY — URETEROSCOPY, WITH LITHOTRIPSY USING HOLMIUM LASER
Anesthesia: General | Site: Ureter | Laterality: Right

## 2017-12-23 MED ORDER — IOTHALAMATE MEGLUMINE 43 % IV SOLN
INTRAVENOUS | Status: DC | PRN
  Administered 2017-12-23: 5 mL

## 2017-12-23 MED ORDER — ONDANSETRON HCL 4 MG/2ML IJ SOLN
INTRAMUSCULAR | Status: DC | PRN
  Administered 2017-12-23: 4 mg via INTRAVENOUS

## 2017-12-23 MED ORDER — EPHEDRINE SULFATE 50 MG/ML IJ SOLN
INTRAMUSCULAR | Status: DC | PRN
  Administered 2017-12-23: 10 mg via INTRAVENOUS
  Administered 2017-12-23 (×2): 5 mg via INTRAVENOUS

## 2017-12-23 MED ORDER — SUCCINYLCHOLINE CHLORIDE 20 MG/ML IJ SOLN
INTRAMUSCULAR | Status: AC
  Filled 2017-12-23: qty 1

## 2017-12-23 MED ORDER — DEXAMETHASONE SODIUM PHOSPHATE 10 MG/ML IJ SOLN
INTRAMUSCULAR | Status: DC | PRN
  Administered 2017-12-23: 10 mg via INTRAVENOUS

## 2017-12-23 MED ORDER — ACETAMINOPHEN 325 MG PO TABS: 650.0000 mg | ORAL_TABLET | Freq: Four times a day (QID) | ORAL | Status: AC | PRN

## 2017-12-23 MED ORDER — FENTANYL CITRATE (PF) 100 MCG/2ML IJ SOLN
INTRAMUSCULAR | Status: AC
  Filled 2017-12-23: qty 2

## 2017-12-23 MED ORDER — CEFAZOLIN SODIUM-DEXTROSE 2-4 GM/100ML-% IV SOLN
2.0000 g | Freq: Once | INTRAVENOUS | Status: AC
  Administered 2017-12-23: 2 g via INTRAVENOUS
  Filled 2017-12-23: qty 100

## 2017-12-23 MED ORDER — ACETAMINOPHEN 10 MG/ML IV SOLN
INTRAVENOUS | Status: AC
  Filled 2017-12-23: qty 100

## 2017-12-23 MED ORDER — LIDOCAINE HCL (CARDIAC) PF 100 MG/5ML IV SOSY
PREFILLED_SYRINGE | INTRAVENOUS | Status: DC | PRN
  Administered 2017-12-23: 20 mg via INTRAVENOUS
  Administered 2017-12-23: 80 mg via INTRAVENOUS

## 2017-12-23 MED ORDER — PROPOFOL 10 MG/ML IV BOLUS
INTRAVENOUS | Status: AC
  Filled 2017-12-23: qty 20

## 2017-12-23 MED ORDER — OXYCODONE HCL 5 MG PO TABS: 5.0000 mg | ORAL_TABLET | Freq: Once | ORAL | Status: DC | PRN

## 2017-12-23 MED ORDER — ONDANSETRON 4 MG PO TBDP: 4.0000 mg | ORAL_TABLET | Freq: Three times a day (TID) | ORAL | 0 refills | Status: DC | PRN

## 2017-12-23 MED ORDER — SEVOFLURANE IN SOLN
RESPIRATORY_TRACT | Status: AC
  Filled 2017-12-23: qty 250

## 2017-12-23 MED ORDER — OXYCODONE-ACETAMINOPHEN 10-325 MG PO TABS: 1.0000 | ORAL_TABLET | Freq: Four times a day (QID) | ORAL | 0 refills | Status: DC | PRN

## 2017-12-23 MED ORDER — ACETAMINOPHEN 10 MG/ML IV SOLN
INTRAVENOUS | Status: DC | PRN
  Administered 2017-12-23: 1000 mg via INTRAVENOUS

## 2017-12-23 MED ORDER — FENTANYL CITRATE (PF) 100 MCG/2ML IJ SOLN: 25.0000 ug | INTRAMUSCULAR | Status: DC | PRN

## 2017-12-23 MED ORDER — ONDANSETRON HCL 4 MG/2ML IJ SOLN
INTRAMUSCULAR | Status: AC
  Filled 2017-12-23: qty 2

## 2017-12-23 MED ORDER — TAMSULOSIN HCL 0.4 MG PO CAPS: 0.4000 mg | ORAL_CAPSULE | Freq: Every day | ORAL | 0 refills | Status: AC

## 2017-12-23 MED ORDER — PROMETHAZINE HCL 25 MG/ML IJ SOLN: 6.2500 mg | INTRAMUSCULAR | Status: DC | PRN

## 2017-12-23 MED ORDER — PROPOFOL 10 MG/ML IV BOLUS
INTRAVENOUS | Status: DC | PRN
  Administered 2017-12-23: 30 mg via INTRAVENOUS
  Administered 2017-12-23: 20 mg via INTRAVENOUS
  Administered 2017-12-23: 150 mg via INTRAVENOUS

## 2017-12-23 MED ORDER — OXYCODONE HCL 5 MG/5ML PO SOLN: 5.0000 mg | Freq: Once | ORAL | Status: DC | PRN

## 2017-12-23 MED ORDER — FENTANYL CITRATE (PF) 100 MCG/2ML IJ SOLN
INTRAMUSCULAR | Status: DC | PRN
  Administered 2017-12-23: 50 ug via INTRAVENOUS
  Administered 2017-12-23: 25 ug via INTRAVENOUS
  Administered 2017-12-23: 75 ug via INTRAVENOUS

## 2017-12-23 MED ORDER — ONDANSETRON HCL 4 MG/2ML IJ SOLN: 4.0000 mg | Freq: Four times a day (QID) | INTRAMUSCULAR | Status: DC | PRN

## 2017-12-23 SURGICAL SUPPLY — 23 items
BAG DRAIN CYSTO-URO LG1000N (MISCELLANEOUS) ×3 IMPLANT
BASKET ZERO TIP 1.9FR (BASKET) IMPLANT
CATH FOL LX CONE TIP  8F (CATHETERS) ×2
CATH FOL LX CONE TIP 8F (CATHETERS) ×1 IMPLANT
CATH URETL OPEN END 6FR 70 (CATHETERS) ×3 IMPLANT
CONRAY 43 FOR UROLOGY 50M (MISCELLANEOUS) ×3 IMPLANT
FEE TECHNICIAN ONLY PER HOUR (MISCELLANEOUS) IMPLANT
GLOVE BIO SURGEON STRL SZ7 (GLOVE) ×6 IMPLANT
GLOVE BIO SURGEON STRL SZ7.5 (GLOVE) ×3 IMPLANT
GOWN STRL REUS W/ TWL LRG LVL4 (GOWN DISPOSABLE) ×1 IMPLANT
GOWN STRL REUS W/TWL LRG LVL4 (GOWN DISPOSABLE) ×2
GOWN STRL REUS W/TWL XL LVL4 (GOWN DISPOSABLE) ×3 IMPLANT
GUIDEWIRE ANG ZIPWIRE 035X150 (WIRE) ×3 IMPLANT
HOLMIUM LASER 550 FIBER (Laser) IMPLANT
INTRODUCER DILATOR DOUBLE (INTRODUCER) ×3 IMPLANT
KIT TURNOVER CYSTO (KITS) ×3 IMPLANT
PACK CYSTO AR (MISCELLANEOUS) ×3 IMPLANT
SENSORWIRE 0.038 NOT ANGLED (WIRE) ×3
SET CYSTO W/LG BORE CLAMP LF (SET/KITS/TRAYS/PACK) ×3 IMPLANT
SHEATH URETERAL 12FR 45CM (SHEATH) ×3 IMPLANT
STENT URET 6FRX26 CONTOUR (STENTS) ×3 IMPLANT
WIRE G XSTIFF 025X145 (WIRE) ×3 IMPLANT
WIRE SENSOR 0.038 NOT ANGLED (WIRE) ×1 IMPLANT

## 2017-12-23 NOTE — Progress Notes (Signed)
Day of Surgery Subjective: Patient reports continued right flank pain and nausea. Seen and examined on rounds this AM.   Objective: Vital signs in last 24 hours: Temp:  [98 F (36.7 C)-98.8 F (37.1 C)] 98.7 F (37.1 C) (10/05 0419) Pulse Rate:  [59-83] 59 (10/05 0910) Resp:  [12-20] 20 (10/05 0419) BP: (124-186)/(69-101) 151/81 (10/05 0910) SpO2:  [94 %-100 %] 96 % (10/05 0419) Weight:  [93.4 kg] 93.4 kg (10/04 2256)  Intake/Output from previous day: 10/04 0701 - 10/05 0700 In: 1513.2 [P.O.:240; I.V.:1273.2] Out: 400 [Urine:400] Intake/Output this shift: Total I/O In: 667.1 [I.V.:667.1] Out: 200 [Urine:200]  Physical Exam:  NAD Sitting on edge of bed, rocking around  No focal deficits   Lab Results: Recent Labs    12/22/17 0418  HGB 15.2  HCT 42.4   BMET Recent Labs    12/22/17 0418 12/23/17 0512  NA 143 139  K 4.1 4.2  CL 108 108  CO2 27 25  GLUCOSE 139* 128*  BUN 22 25*  CREATININE 1.60* 2.16*  CALCIUM 9.2 8.2*   No results for input(s): LABPT, INR in the last 72 hours. No results for input(s): LABURIN in the last 72 hours. No results found for this or any previous visit.  Studies/Results: Dg Abd 1 View  Result Date: 12/23/2017 CLINICAL DATA:  Right ureteral stone EXAM: ABDOMEN - 1 VIEW COMPARISON:  CT 12/22/2017 FINDINGS: Right lower pole renal stone again noted. Previously seen small mid right ureteral stone appears to have migrated distally, likely in the distal right ureter overlying the right side of the sacrum. Calcified phleboliths in the pelvis. Nonobstructive bowel gas pattern. IMPRESSION: Probable distal migration of the right ureteral stone now in the distal right ureter overlying the right side of the sacrum. Right lower pole nephrolithiasis Electronically Signed   By: Charlett Nose M.D.   On: 12/23/2017 09:55   Ct Renal Stone Study  Result Date: 12/22/2017 CLINICAL DATA:  Right flank pain.  History of kidney stones. EXAM: CT ABDOMEN AND  PELVIS WITHOUT CONTRAST TECHNIQUE: Multidetector CT imaging of the abdomen and pelvis was performed following the standard protocol without IV contrast. COMPARISON:  CT 09/23/2007 FINDINGS: Lower chest: Unchanged clustered nodules in the right middle lobe, stability over 10 years consistent with benign process. Hepatobiliary: No focal liver abnormality is seen. No gallstones, gallbladder wall thickening, or biliary dilatation. Pancreas: No ductal dilatation or inflammation. Spleen: Normal in size without focal abnormality. Adrenals/Urinary Tract: Normal adrenal glands. Obstructing 5 x 5 mm stone in the right mid ureter with moderate proximal hydroureteronephrosis and perinephric edema. 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 Two nonobstructing stones in the left kidney. No left hydronephrosis. Left ureter is decompressed. Urinary bladder is physiologically distended. No bladder wall thickening. No bladder stone. Stomach/Bowel: Moderate diverticulosis of the descending colon. No diverticulitis. No bowel wall thickening, inflammatory change or obstruction. Normal appendix. Stomach is nondistended. Vascular/Lymphatic: Aortic atherosclerosis and tortuosity. No aneurysm. No enlarged abdominal or pelvic lymph nodes. Reproductive: Prostate is unremarkable. Other: Multiple pelvic phleboliths.  No free air or free fluid. Musculoskeletal: Scoliosis and degenerative change in the lumbar spine. Modic endplate changes at L3-L4 and L4-L5. There are no acute or suspicious osseous abnormalities. IMPRESSION: 1. Obstructing 5 mm stone in the right mid ureter with moderate hydronephrosis. 2. Bilateral nonobstructing nephrolithiasis. 3.  Aortic Atherosclerosis (ICD10-I70.0). Electronically Signed   By: Narda Rutherford M.D.   On: 12/22/2017 05:15    Assessment/Plan:  Right ureteral  stone - persistent pain, nausea. Cr up slightly again. Discussed again with pt and his brother the nature r/b/a  to cysto, rt rgp, rt urs and stent, possible laser lithotripsy. Discussed he may need a staged procedure. KUB with stone still in mid ureter but slight progression. RMP stone visible.    LOS: 1 day   Jerilee Field 12/23/2017, 11:23 AM

## 2017-12-23 NOTE — Anesthesia Post-op Follow-up Note (Signed)
Anesthesia QCDR form completed.        

## 2017-12-23 NOTE — Anesthesia Procedure Notes (Signed)
Procedure Name: LMA Insertion Date/Time: 12/23/2017 11:25 AM Performed by: Irving Burton, CRNA Pre-anesthesia Checklist: Patient identified, Emergency Drugs available, Suction available and Patient being monitored Patient Re-evaluated:Patient Re-evaluated prior to induction Oxygen Delivery Method: Circle system utilized Preoxygenation: Pre-oxygenation with 100% oxygen Induction Type: IV induction LMA: LMA inserted LMA Size: 5.0 Number of attempts: 2 Placement Confirmation: positive ETCO2 and breath sounds checked- equal and bilateral Tube secured with: Tape

## 2017-12-23 NOTE — Care Management Obs Status (Signed)
MEDICARE OBSERVATION STATUS NOTIFICATION   Patient Details  Name: Dustin Reese MRN: [id number] Date of Birth: 08-27-1945   Medicare Observation Status Notification Given:  Yes  Code 44 explained to patient's wife- judy. She preferred not to sign.    Collie Siad, RN 12/23/2017, 12:23 PM

## 2017-12-23 NOTE — Progress Notes (Signed)
I spoke with patient and discussed procedural findings.  Discussed again need for staged procedure and follow-up instructions.  Discussed importance of follow-up with the stent.  He looks much better and finished lunch.

## 2017-12-23 NOTE — Care Management (Signed)
Message sent to UR RN and Dr. Amado Coe regarding code 44 flag. Patient is currently in a procedure.

## 2017-12-23 NOTE — Transfer of Care (Signed)
Immediate Anesthesia Transfer of Care Note  Patient: Dustin Reese  Procedure(s) Performed: Right URETEROSCOPY, right retrograde pylogram, right ureteral stent (Right Ureter)  Patient Location: PACU  Anesthesia Type:General  Level of Consciousness: sedated  Airway & Oxygen Therapy: Patient connected to face mask oxygen  Post-op Assessment: Post -op Vital signs reviewed and stable  Post vital signs: stable  Last Vitals:  Vitals Value Taken Time  BP 107/60 12/23/2017 12:11 PM  Temp    Pulse 68 12/23/2017 12:11 PM  Resp 18 12/23/2017 12:11 PM  SpO2 97 % 12/23/2017 12:11 PM  Vitals shown include unvalidated device data.  Last Pain:  Vitals:   12/23/17 0500  TempSrc:   PainSc: 4          Complications: No apparent anesthesia complications

## 2017-12-23 NOTE — Anesthesia Preprocedure Evaluation (Addendum)
Anesthesia Evaluation  Patient identified by MRN, date of birth, ID band Patient awake    Reviewed: Allergy & Precautions, H&P , NPO status , Patient's Chart, lab work & pertinent test results  Airway Mallampati: III   Neck ROM: full    Dental  (+) Teeth Intact   Pulmonary neg COPD, former smoker,    breath sounds clear to auscultation       Cardiovascular hypertension, (-) angina(-) Past MI, (-) Cardiac Stents and (-) CHF  Rhythm:regular Rate:Normal     Neuro/Psych negative neurological ROS  negative psych ROS   GI/Hepatic negative GI ROS, Neg liver ROS, GERD  Controlled,  Endo/Other  negative endocrine ROS  Renal/GU      Musculoskeletal   Abdominal   Peds  Hematology negative hematology ROS (+)   Anesthesia Other Findings Past Medical History: No date: HTN (hypertension)  History reviewed. No pertinent surgical history.  BMI    Body Mass Index:  27.17 kg/m      Reproductive/Obstetrics negative OB ROS                            Anesthesia Physical Anesthesia Plan  ASA: II  Anesthesia Plan: General LMA   Post-op Pain Management:    Induction:   PONV Risk Score and Plan: Ondansetron and Dexamethasone  Airway Management Planned:   Additional Equipment:   Intra-op Plan:   Post-operative Plan:   Informed Consent: I have reviewed the patients History and Physical, chart, labs and discussed the procedure including the risks, benefits and alternatives for the proposed anesthesia with the patient or authorized representative who has indicated his/her understanding and acceptance.   Dental Advisory Given  Plan Discussed with: Anesthesiologist, CRNA and Surgeon  Anesthesia Plan Comments:         Anesthesia Quick Evaluation

## 2017-12-23 NOTE — Discharge Instructions (Signed)
° °  Follow-up with primary care physician in 3 days Follow-up with urology in 7 to 10 days as recommended by urology Dr. Modena Slater repeat BMP during the follow-up visit   Ureteral Stent Implantation, Care After Refer to this sheet in the next few weeks. These instructions provide you with information about caring for yourself after your procedure. Your health care provider may also give you more specific instructions. Your treatment has been planned according to current medical practices, but problems sometimes occur. Call your health care provider if you have any problems or questions after your procedure.  Be sure to follow-up with one of Dr. Estil Daft associates to plan stent/stone removal  What can I expect after the procedure? After the procedure, it is common to have:  Nausea.  Mild pain when you urinate. You may feel this pain in your lower back or lower abdomen. Pain should stop within a few minutes after you urinate. This may last for up to 1 week.  A small amount of blood in your urine for several days.  Follow these instructions at home:  Medicines  Take over-the-counter and prescription medicines only as told by your health care provider.  If you were prescribed an antibiotic medicine, take it as told by your health care provider. Do not stop taking the antibiotic even if you start to feel better.  Do not drive for 24 hours if you received a sedative.  Do not drive or operate heavy machinery while taking prescription pain medicines. Activity  Return to your normal activities as told by your health care provider. Ask your health care provider what activities are safe for you.  Do not lift anything that is heavier than 10 lb (4.5 kg). Follow this limit for 1 week after your procedure, or for as long as told by your health care provider. General instructions  Watch for any blood in your urine. Call your health care provider if the amount of blood in your urine  increases.  If you have a catheter: ? Follow instructions from your health care provider about taking care of your catheter and collection bag. ? Do not take baths, swim, or use a hot tub until your health care provider approves.  Drink enough fluid to keep your urine clear or pale yellow.  Keep all follow-up visits as told by your health care provider. This is important. Contact a health care provider if:  You have pain that gets worse or does not get better with medicine, especially pain when you urinate.  You have difficulty urinating.  You feel nauseous or you vomit repeatedly during a period of more than 2 days after the procedure. Get help right away if:  Your urine is dark red or has blood clots in it.  You are leaking urine (have incontinence).  The end of the stent comes out of your urethra.  You cannot urinate.  You have sudden, sharp, or severe pain in your abdomen or lower back.  You have a fever. This information is not intended to replace advice given to you by your health care provider. Make sure you discuss any questions you have with your health care provider. Document Released: 11/07/2012 Document Revised: 08/13/2015 Document Reviewed: 09/19/2014 Elsevier Interactive Patient Education  [employer].

## 2017-12-23 NOTE — Op Note (Signed)
Preoperative diagnosis: Right ureteral stone, right renal stone, flank pain, nausea Postoperative diagnosis: Same  Procedure: Cystoscopy with right retrograde pyelogram, right ureteroscopy and right ureteral stent placement  Surgeon: Mena Goes  Anesthesia: General  Indication for procedure: 72 year old white male with persistent right flank pain and nausea with a 4 mm right mid ureteral stone.  He also had a stone or 2 up in the right kidney.  Findings: On cystoscopy the urethra was unremarkable as well as the prostate and bladder.  There were no stones or foreign bodies in the bladder.  Right retrograde pyelogram- this outlined a narrow distal ureter that curved and deviated medially and then went up toward the opacity seen on the KUB and scout imaging where there was a subtle filling defect consistent with the stone and some mild proximal hydroureteronephrosis.  On ureteroscopy the ureterovesical junction was quite tight and required dilation with the inner cannula of the access sheath.  The ureter was difficult to navigate as it swung medially and then superiorly toward the iliacs where it narrowed.  I never saw the stone it still appears to be above the iliacs.  The ureter was quite tight and quite a bit of torque on it to get to this point therefore I thought it was best to leave a stent and come back for the ureteral and renal stones at a later date.  Description of procedure: After consent was obtained patient brought to the operating room.  After adequate anesthesia he was placed in lithotomy position and prepped and draped in usual sterile fashion.  A timeout was performed to confirm the patient and procedure.  The cystoscope was passed per urethra and the bladder inspected.  I cannulated the right ureteral orifice with a 6 [place] open-ended catheter and retrograde injection of contrast was performed.  I then advanced an angled sensor wire and coiled this in the collecting system.  Over the  wire I passed the 6 [place] open-ended catheter and there was a bit of resistance at the stone and it did progress into the proximal ureter.  It was backed out and the bladder drained and the scope removed.  I then passed the dual channel semirigid ureteroscope but could not get through the ureterovesical junction.  The scope was backed out and over the wire I passed the inner cannula of the access sheath which just engage the distal ureter but I could not get to the stone.  The cannula was removed and I repassed the semirigid ureteroscope and was able to get into the distal ureter and carefully progressed medially and superiorly and just was able to get toward the iliacs where the ureter narrowed.  I could not see the stone and was not quite to the iliacs yet.  I waited here for a minute for the ureter to passively dilate with the scope and irrigation pressure and there was no relaxing of the ureter.  I tried to carefully advance the scope closer but could not see a good lumen nor the stone and there was a bit of torque on the scope and ureter.  Therefore I backed the scope out and backloaded the wire on the cystoscope and reinserted the cystoscope.  A 6 x 26 cm stent was advanced and the wire removed with a good coil seen in the renal pelvis and a good coil in the bladder.  The stent appeared to be draining well.  The urine appeared dark but not purulent.  The bladder was drained and the  scope removed and he was awakened and taken to recovery room in stable condition.  Complications: None  Blood loss: Minimal  Specimens: None  Drains: 6 x 26 cm right ureteral stent  Disposition: Patient stable to PACU

## 2017-12-23 NOTE — Anesthesia Postprocedure Evaluation (Signed)
Anesthesia Post Note  Patient: Dustin Reese  Procedure(s) Performed: Right URETEROSCOPY, right retrograde pylogram, right ureteral stent (Right Ureter)  Patient location during evaluation: PACU Anesthesia Type: General Level of consciousness: awake and alert Pain management: pain level controlled Vital Signs Assessment: post-procedure vital signs reviewed and stable Respiratory status: spontaneous breathing, nonlabored ventilation and respiratory function stable Cardiovascular status: blood pressure returned to baseline and stable Postop Assessment: no apparent nausea or vomiting Anesthetic complications: no     Last Vitals:  Vitals:   12/23/17 1323 12/23/17 1358  BP: (!) 143/88 (!) 142/83  Pulse: 64 63  Resp: 14 12  Temp: (!) 36.4 C (!) 36.3 C  SpO2: 100% 98%    Last Pain:  Vitals:   12/23/17 1358  TempSrc: Oral  PainSc:                  Jovita Gamma

## 2017-12-23 NOTE — Discharge Summary (Signed)
[facility] - [place] at [facility]   PATIENT NAME: Dustin Reese    MR#:  [id number]  DATE OF BIRTH:  July 03, 1945  DATE OF ADMISSION:  12/22/2017 ADMITTING PHYSICIAN: Enedina Finner, MD  DATE OF DISCHARGE: 12/23/17   PRIMARY CARE PHYSICIAN: System, Pcp Not In    ADMISSION DIAGNOSIS:  Right kidney stone [N20.0]  DISCHARGE DIAGNOSIS:  Active Problems:   Ureteral stone   Right kidney stone Cystoscopy with right ureteral stent placement  SECONDARY DIAGNOSIS:   Past Medical History:  Diagnosis Date  . HTN (hypertension)     HOSPITAL COURSE:  Dustin Reese  is a 72 y.o. male with a known history of hypertension and a kidney stone about 10 years ago comes to the emergency room with significant back pain nausea and vomiting. Patient was found to have bright rights ureteral stone about 5 mm along with hydronephrosis. ER physician spoke with Dr. Mena Goes. Patient is being admitted for further evaluation of management. Patient feels his pain is under control. He is a bit anxious about the stone   .  Acute abdominal pain from Ureterolithiasis in the  right mid ureteral with moderate hydronephrosis  -IV fluids -urology consultation, patient was seen by Dr. Mena Goes -Status post cystoscopy and right ureteral stent placement okay to discharge patient from urology standpoint if patient is tolerating lunch and or dinner -Flomax daily -strain urine -PRN pain meds-tramadol -Outpatient urology follow-up with Dr. Mena Goes, urologist office will call and make an appointment  2. hypertension-stable continue atenolol  3. DVT prophylaxis subcu Lovenox DISCHARGE CONDITIONS:   STABLE  CONSULTS OBTAINED:  Treatment Team:  Jerilee Field, MD   PROCEDURES Cystoscopy with right retrograde pyelogram, right ureteroscopy and right ureteral stent placement  DRUG ALLERGIES:  No Known Allergies  DISCHARGE MEDICATIONS:   Allergies as of 12/23/2017   No Known Allergies      Medication List    TAKE these medications   acetaminophen 325 MG tablet Commonly known as:  TYLENOL Take 2 tablets (650 mg total) by mouth every 6 (six) hours as needed for mild pain (or Fever >/= 101).   atenolol 25 MG tablet Commonly known as:  TENORMIN Take 25 mg by mouth daily.   ondansetron 4 MG disintegrating tablet Commonly known as:  ZOFRAN-ODT Take 1 tablet (4 mg total) by mouth every 8 (eight) hours as needed for nausea or vomiting.   oxyCODONE-acetaminophen 10-325 MG tablet Commonly known as:  PERCOCET Take 1 tablet by mouth every 6 (six) hours as needed for pain.   tamsulosin 0.4 MG Caps capsule Commonly known as:  FLOMAX Take 1 capsule (0.4 mg total) by mouth daily after breakfast for 7 days.   temazepam 15 MG capsule Commonly known as:  RESTORIL Take 15 mg by mouth at bedtime.        DISCHARGE INSTRUCTIONS:   Follow-up with primary care physician in 3 days Follow-up with urology in 7 to 10 days as recommended by urology Dr. Modena Slater repeat BMP during the follow-up visit  DIET:  Low-salt  DISCHARGE CONDITION:  Stable  ACTIVITY:  Activity as tolerated  OXYGEN:  Home Oxygen: No.   Oxygen Delivery: room air  DISCHARGE LOCATION:  home   If you experience worsening of your admission symptoms, develop shortness of breath, life threatening emergency, suicidal or homicidal thoughts you must seek medical attention immediately by calling 911 or calling your MD immediately  if symptoms less severe.  You Must read complete instructions/literature along with all the  possible adverse reactions/side effects for all the Medicines you take and that have been prescribed to you. Take any new Medicines after you have completely understood and accpet all the possible adverse reactions/side effects.   Please note  You were cared for by a hospitalist during your hospital stay. If you have any questions about your discharge medications or the care you received  while you were in the hospital after you are discharged, you can call the unit and asked to speak with the hospitalist on call if the hospitalist that took care of you is not available. Once you are discharged, your primary care physician will handle any further medical issues. Please note that NO REFILLS for any discharge medications will be authorized once you are discharged, as it is imperative that you return to your primary care physician (or establish a relationship with a primary care physician if you do not have one) for your aftercare needs so that they can reassess your need for medications and monitor your lab values.     Today  Chief Complaint  Patient presents with  . Flank Pain   Patient's pain is well tolerated, seen by urology Dr. Mena Goes, patient had  cystoscopy and stent placement.  Tolerated procedure well  ROS:  CONSTITUTIONAL: Denies fevers, chills. Denies any fatigue, weakness.  EYES: Denies blurry vision, double vision, eye pain. EARS, NOSE, THROAT: Denies tinnitus, ear pain, hearing loss. RESPIRATORY: Denies cough, wheeze, shortness of breath.  CARDIOVASCULAR: Denies chest pain, palpitations, edema.  GASTROINTESTINAL: Denies nausea, vomiting, diarrhea, abdominal pain. Denies bright red blood per rectum. GENITOURINARY: Denies dysuria, hematuria. ENDOCRINE: Denies nocturia or thyroid problems. HEMATOLOGIC AND LYMPHATIC: Denies easy bruising or bleeding. SKIN: Denies rash or lesion. MUSCULOSKELETAL: Denies pain in neck, back, shoulder, knees, hips or arthritic symptoms.  NEUROLOGIC: Denies paralysis, paresthesias.  PSYCHIATRIC: Denies anxiety or depressive symptoms.   VITAL SIGNS:  Blood pressure (!) 143/88, pulse 64, temperature (!) 97.5 F (36.4 C), temperature source Oral, resp. rate 14, height 6\' 1"  (1.854 m), weight 93.4 kg, SpO2 100 %.  I/O:    Intake/Output Summary (Last 24 hours) at 12/23/2017 1329 Last data filed at 12/23/2017 1246 Gross per 24 hour   Intake 2205.31 ml  Output 600 ml  Net 1605.31 ml    PHYSICAL EXAMINATION:  GENERAL:  72 y.o.-year-old patient lying in the bed with no acute distress.  EYES: Pupils equal, round, reactive to light and accommodation. No scleral icterus. Extraocular muscles intact.  HEENT: Head atraumatic, normocephalic. Oropharynx and nasopharynx clear.  NECK:  Supple, no jugular venous distention. No thyroid enlargement, no tenderness.  LUNGS: Normal breath sounds bilaterally, no wheezing, rales,rhonchi or crepitation. No use of accessory muscles of respiration.  CARDIOVASCULAR: S1, S2 normal. No murmurs, rubs, or gallops.  ABDOMEN: Soft, non-tender, non-distended. Bowel sounds present. No organomegaly or mass.  EXTREMITIES: No pedal edema, cyanosis, or clubbing.  NEUROLOGIC: Cranial nerves II through XII are intact. Muscle strength 5/5 in all extremities. Sensation intact. Gait not checked.  PSYCHIATRIC: The patient is alert and oriented x 3.  SKIN: No obvious rash, lesion, or ulcer.   DATA REVIEW:   CBC Recent Labs  Lab 12/22/17 0418  WBC 9.6  HGB 15.2  HCT 42.4  PLT 190    Chemistries  Recent Labs  Lab 12/22/17 0418 12/23/17 0512  NA 143 139  K 4.1 4.2  CL 108 108  CO2 27 25  GLUCOSE 139* 128*  BUN 22 25*  CREATININE 1.60* 2.16*  CALCIUM 9.2  8.2*  AST 34  --   ALT 29  --   ALKPHOS 137*  --   BILITOT 0.9  --     Cardiac Enzymes No results for input(s): TROPONINI in the last 168 hours.  Microbiology Results  No results found for this or any previous visit.  RADIOLOGY:  Dg Abd 1 View  Result Date: 12/23/2017 CLINICAL DATA:  Right ureteral stone EXAM: ABDOMEN - 1 VIEW COMPARISON:  CT 12/22/2017 FINDINGS: Right lower pole renal stone again noted. Previously seen small mid right ureteral stone appears to have migrated distally, likely in the distal right ureter overlying the right side of the sacrum. Calcified phleboliths in the pelvis. Nonobstructive bowel gas pattern.  IMPRESSION: Probable distal migration of the right ureteral stone now in the distal right ureter overlying the right side of the sacrum. Right lower pole nephrolithiasis Electronically Signed   By: Charlett Nose M.D.   On: 12/23/2017 09:55   Ct Renal Stone Study  Result Date: 12/22/2017 CLINICAL DATA:  Right flank pain.  History of kidney stones. EXAM: CT ABDOMEN AND PELVIS WITHOUT CONTRAST TECHNIQUE: Multidetector CT imaging of the abdomen and pelvis was performed following the standard protocol without IV contrast. COMPARISON:  CT 09/23/2007 FINDINGS: Lower chest: Unchanged clustered nodules in the right middle lobe, stability over 10 years consistent with benign process. Hepatobiliary: No focal liver abnormality is seen. No gallstones, gallbladder wall thickening, or biliary dilatation. Pancreas: No ductal dilatation or inflammation. Spleen: Normal in size without focal abnormality. Adrenals/Urinary Tract: Normal adrenal glands. Obstructing 5 x 5 mm stone in the right mid ureter with moderate proximal hydroureteronephrosis and perinephric edema. 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 Two nonobstructing stones in the left kidney. No left hydronephrosis. Left ureter is decompressed. Urinary bladder is physiologically distended. No bladder wall thickening. No bladder stone. Stomach/Bowel: Moderate diverticulosis of the descending colon. No diverticulitis. No bowel wall thickening, inflammatory change or obstruction. Normal appendix. Stomach is nondistended. Vascular/Lymphatic: Aortic atherosclerosis and tortuosity. No aneurysm. No enlarged abdominal or pelvic lymph nodes. Reproductive: Prostate is unremarkable. Other: Multiple pelvic phleboliths.  No free air or free fluid. Musculoskeletal: Scoliosis and degenerative change in the lumbar spine. Modic endplate changes at L3-L4 and L4-L5. There are no acute or suspicious osseous abnormalities. IMPRESSION: 1. Obstructing 5 mm  stone in the right mid ureter with moderate hydronephrosis. 2. Bilateral nonobstructing nephrolithiasis. 3.  Aortic Atherosclerosis (ICD10-I70.0). Electronically Signed   By: Narda Rutherford M.D.   On: 12/22/2017 05:15    EKG:  No orders found for this or any previous visit.    Management plans discussed with the patient, family and they are in agreement.  CODE STATUS:     Code Status Orders  (From admission, onward)         Start     Ordered   12/22/17 1348  Full code  Continuous     12/22/17 1347        Code Status History    This patient has a current code status but no historical code status.      TOTAL TIME TAKING CARE OF THIS PATIENT41 minutes.   Note: This dictation was prepared with Dragon dictation along with smaller phrase technology. Any transcriptional errors that result from this process are unintentional.   @[facility] @  on 12/23/2017 at 1:29 PM  Between 7am to 6pm - Pager - [phone]  After 6pm go to [email or web address] - password [email or web address] [facility]  Office  [phone]  CC: Primary care physician; System, Pcp Not In

## 2017-12-23 NOTE — Progress Notes (Signed)
Discharge teaching given to patient, patient verbalized understanding and had no questions. Patient IV removed. Patient will be transported home by family. All patient belongings gathered prior to leaving.  

## 2017-12-24 ENCOUNTER — Encounter: Payer: Self-pay | Admitting: Urology

## 2017-12-25 ENCOUNTER — Telehealth: Payer: Self-pay | Admitting: Urology

## 2017-12-25 NOTE — Telephone Encounter (Signed)
App has been made and patient is aware   ° °Dustin Reese  °

## 2017-12-25 NOTE — Telephone Encounter (Signed)
-----   Message from Jerilee Field, MD sent at 12/23/2017 12:12 PM EDT ----- Pt needs f/u in a week or two with PA or MD to schedule Right ureteroscopy - he was stented for a right ureteral stone 10/5 and has a right renal stone, too. Thanks.

## 2017-12-26 ENCOUNTER — Telehealth: Payer: Self-pay

## 2017-12-26 NOTE — Telephone Encounter (Signed)
Flagged on EMMI report for having questions about discharge papers.  Called and spoke with patient.  He mentioned he doesn't have any questions at this time and must have answered the question wrong.  States he is doing okay and has a follow up with [facility] on 10/10 for kidney stones.  I thanked him for his time and informed him that he would receive one more automated call checking in during the next few days.

## 2017-12-28 ENCOUNTER — Encounter: Payer: Self-pay | Admitting: Urology

## 2017-12-28 ENCOUNTER — Other Ambulatory Visit: Payer: Self-pay | Admitting: Radiology

## 2017-12-28 ENCOUNTER — Ambulatory Visit: Payer: Medicare Other | Admitting: Urology

## 2017-12-28 ENCOUNTER — Telehealth: Payer: Self-pay | Admitting: Radiology

## 2017-12-28 VITALS — BP 169/87 | HR 58 | Ht 73.0 in | Wt 203.0 lb

## 2017-12-28 DIAGNOSIS — N201 Calculus of ureter: Secondary | ICD-10-CM

## 2017-12-28 DIAGNOSIS — N200 Calculus of kidney: Secondary | ICD-10-CM | POA: Diagnosis not present

## 2017-12-28 LAB — URINALYSIS, COMPLETE
BILIRUBIN UA: NEGATIVE
Glucose, UA: NEGATIVE
Ketones, UA: NEGATIVE
Nitrite, UA: NEGATIVE
PH UA: 7 (ref 5.0–7.5)
Specific Gravity, UA: 1.02 (ref 1.005–1.030)
Urobilinogen, Ur: 0.2 mg/dL (ref 0.2–1.0)

## 2017-12-28 LAB — MICROSCOPIC EXAMINATION
BACTERIA UA: NONE SEEN
RBC, UA: >30 /hpf — ABNORMAL HIGH (ref 0–2)
WBC, UA: NONE SEEN /hpf (ref 0–5)

## 2017-12-28 NOTE — Telephone Encounter (Signed)
Dr Apolinar Junes, Patient has been scheduled for right ureteroscopy, laser lithotripsy & stent exchange with you on 01/10/2018. He saw Carollee Herter on 12/28/2017.

## 2017-12-28 NOTE — H&P (View-Only) (Signed)
  12/28/2017 11:30 AM   Dustin Reese 05/19/1945 [id number]  Referring provider: No referring provider defined for this encounter.  Chief Complaint  Patient presents with  . Nephrolithiasis    HPI: Patient is a 72-year-old Caucasian male who underwent emergent right ureteral stent placement due to uncontrollable pain on 12/23/2017 for an obstructing right ureteral stone.  Since the stent placement, he has been having stent discomfort.  He states it is quite bothersome to him and is wanting to have the next procedure scheduled quickly.  He has Flomax and Percocet given to him by th ED.  He has not passed a fragment.   His UA is positive for > 30 RBC's.     PMH: Past Medical History:  Diagnosis Date  . GERD (gastroesophageal reflux disease)   . History of kidney stones   . HTN (hypertension)     Surgical History: Past Surgical History:  Procedure Laterality Date  . BACK SURGERY    . CARPAL TUNNEL RELEASE Bilateral   . EYE SURGERY     Cataract  . TONSILLECTOMY    . URETEROSCOPY WITH HOLMIUM LASER LITHOTRIPSY Right 12/23/2017   Procedure: Right URETEROSCOPY, right retrograde pylogram, right ureteral stent;  Surgeon: Eskridge, Matthew, MD;  Location: [facility] ORS;  Service: Urology;  Laterality: Right;    Home Medications:  Allergies as of 12/28/2017   No Known Allergies     Medication List        Accurate as of 12/28/17 11:59 PM. Always use your most recent med list.          acetaminophen 325 MG tablet Commonly known as:  TYLENOL Take 2 tablets (650 mg total) by mouth every 6 (six) hours as needed for mild pain (or Fever >/= 101).   atenolol 25 MG tablet Commonly known as:  TENORMIN Take 25 mg by mouth daily.   ondansetron 4 MG disintegrating tablet Commonly known as:  ZOFRAN-ODT Take 1 tablet (4 mg total) by mouth every 8 (eight) hours as needed for nausea or vomiting.   oxyCODONE-acetaminophen 10-325 MG tablet Commonly known as:  PERCOCET Take 1 tablet  by mouth every 6 (six) hours as needed for pain.   tamsulosin 0.4 MG Caps capsule Commonly known as:  FLOMAX Take 0.4 mg by mouth daily.   tamsulosin 0.4 MG Caps capsule Commonly known as:  FLOMAX Take 1 capsule (0.4 mg total) by mouth daily after breakfast for 7 days.   temazepam 15 MG capsule Commonly known as:  RESTORIL Take 15 mg by mouth at bedtime as needed for sleep.   vitamin E 400 UNIT capsule Take 400 Units by mouth daily.       Allergies: No Known Allergies  Family History: History reviewed. No pertinent family history.  Social History:  reports that he quit smoking about 50 years ago. His smoking use included cigarettes. He has a 2.50 pack-year smoking history. He quit smokeless tobacco use about 50 years ago.  His smokeless tobacco use included chew. He reports that he drank alcohol. He reports that he does not use drugs.  ROS: UROLOGY Frequent Urination?: No Hard to postpone urination?: No Burning/pain with urination?: No Get up at night to urinate?: No Leakage of urine?: No Urine stream starts and stops?: No Trouble starting stream?: No Do you have to strain to urinate?: No Blood in urine?: No Urinary tract infection?: No Sexually transmitted disease?: No Injury to kidneys or bladder?: No Painful intercourse?: No Weak stream?: No Erection problems?: No   Penile pain?: No  Gastrointestinal Nausea?: No Vomiting?: No Indigestion/heartburn?: No Diarrhea?: No Constipation?: No  Constitutional Fever: No Night sweats?: No Weight loss?: No Fatigue?: No  Skin Skin rash/lesions?: No Itching?: No  Eyes Blurred vision?: No Double vision?: No  Ears/Nose/Throat Sore throat?: No Sinus problems?: No  Hematologic/Lymphatic Swollen glands?: No Easy bruising?: No  Cardiovascular Leg swelling?: No Chest pain?: No  Respiratory Cough?: No Shortness of breath?: No  Endocrine Excessive thirst?: No  Musculoskeletal Back pain?: No Joint  pain?: No  Neurological Headaches?: No Dizziness?: No  Psychologic Depression?: No Anxiety?: No  Physical Exam: BP (!) 169/87 (BP Location: Left Arm, Patient Position: Sitting, Cuff Size: Normal)   Pulse (!) 58   Ht 6' 1" (1.854 m)   Wt 203 lb (92.1 kg)   BMI 26.78 kg/m   Constitutional:  Well nourished. Alert and oriented, No acute distress. HEENT: [place] AT, moist mucus membranes.  Trachea midline, no masses. Cardiovascular: No clubbing, cyanosis, or edema. Respiratory: Normal respiratory effort, no increased work of breathing. GI: Abdomen is soft, non tender, non distended, no abdominal masses.  GU: No CVA tenderness.  No bladder fullness or masses.   Skin: No rashes, bruises or suspicious lesions. Lymph: No cervical or inguinal adenopathy. Neurologic: Grossly intact, no focal deficits, moving all 4 extremities. Psychiatric: Normal mood and affect.  Laboratory Data: Lab Results  Component Value Date   WBC 12.6 (H) 01/05/2018   HGB 14.1 01/05/2018   HCT 41.5 01/05/2018   MCV 89.4 01/05/2018   PLT 174 01/05/2018    Lab Results  Component Value Date   CREATININE 1.26 (H) 01/05/2018    No results found for: PSA  No results found for: TESTOSTERONE  No results found for: HGBA1C  No results found for: TSH  No results found for: CHOL, HDL, CHOLHDL, VLDL, LDLCALC  Lab Results  Component Value Date   AST 34 12/22/2017   Lab Results  Component Value Date   ALT 29 12/22/2017   No components found for: ALKALINEPHOPHATASE No components found for: BILIRUBINTOTAL  No results found for: ESTRADIOL  Urinalysis > 30 RBC's.  See Epic.  I have reviewed the labs.   Pertinent Imaging: CLINICAL DATA:  Right flank pain.  History of kidney stones.  EXAM: CT ABDOMEN AND PELVIS WITHOUT CONTRAST  TECHNIQUE: Multidetector CT imaging of the abdomen and pelvis was performed following the standard protocol without IV contrast.  COMPARISON:  CT  09/23/2007  FINDINGS: Lower chest: Unchanged clustered nodules in the right middle lobe, stability over 10 years consistent with benign process.  Hepatobiliary: No focal liver abnormality is seen. No gallstones, gallbladder wall thickening, or biliary dilatation.  Pancreas: No ductal dilatation or inflammation.  Spleen: Normal in size without focal abnormality.  Adrenals/Urinary Tract: Normal adrenal glands. Obstructing 5 x 5 mm stone in the right mid ureter with moderate proximal hydroureteronephrosis and perinephric edema. 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 Two nonobstructing stones in the left kidney. No left hydronephrosis. Left ureter is decompressed. Urinary bladder is physiologically distended. No bladder wall thickening. No bladder stone.  Stomach/Bowel: Moderate diverticulosis of the descending colon. No diverticulitis. No bowel wall thickening, inflammatory change or obstruction. Normal appendix. Stomach is nondistended.  Vascular/Lymphatic: Aortic atherosclerosis and tortuosity. No aneurysm. No enlarged abdominal or pelvic lymph nodes.  Reproductive: Prostate is unremarkable.  Other: Multiple pelvic phleboliths.  No free air or free fluid.  Musculoskeletal: Scoliosis and degenerative change in the lumbar spine. Modic   endplate changes at L3-L4 and L4-L5. There are no acute or suspicious osseous abnormalities.  IMPRESSION: 1. Obstructing 5 mm stone in the right mid ureter with moderate hydronephrosis. 2. Bilateral nonobstructing nephrolithiasis. 3.  Aortic Atherosclerosis (ICD10-I70.0).   Electronically Signed   By: Melanie  Sanford M.D.   On: 12/22/2017 05:15 I have independently reviewed the films and a right mid ureteral stone is present with hydronephrosis and a  5 mm stone in the lower pole of the right kidney.  Stones are also present in the left kidney.   Assessment & Plan:    1. Right ureteral  stone Patient is status post right ureteral stent placement Schedule right ureteroscopy with laser lithotripsy and ureteral stent exchange Explained to the patient how the procedure is performed and the risks involved Informed patient that he will have the current stent removed for the procedure and a new one placed during the procedure and will remain in place after the procedure for a short time.  Stent may be removed in the office with a cystoscope or patient may be instructed to remove the stent themselves by the string Patient is familiar with "stent pain"  Residual stones within the kidney or ureter may be present after the procedure and may need to have these addressed at a different encounter Injury to the ureter is the most common intra-operative risk, it may result in an open procedure to correct the defect Infection and bleeding are also risks Explained the risks of general anesthesia, such as: MI, CVA, paralysis, coma and/or death. Advised to contact our office or seek treatment in the ED if becomes febrile or pain/ vomiting are difficult control in order to arrange for emergent/urgent intervention - Urinalysis, Complete - CULTURE, URINE COMPREHENSIVE  2. Right kidney stone Will attempt to address this stone during the scheduled URS   3. Right hydronephrosis  - obtain RUS to ensure the hydronephrosis has resolved once they have passed and/or recovered from procedure to ensure to iatrogenic hydronephrosis remains - it is explained to the patient that it is important to document resolution of the hydronephrosis as "silent hydronephrosis" can occur and cause damage and/or loss of the kidney  4. Microscopic hematuria  - UA today demonstrates > 30 RBC's   - continue to monitor the patient's UA after the treatment/passage of the stone to ensure the hematuria has resolved  - if hematuria persists, we will pursue a hematuria workup with CT Urogram and cystoscopy if  appropriate.   Return for right URS/LL/stent exchange .  These notes generated with voice recognition software. I apologize for typographical errors.  Joffre Lucks, PA-C  [facility] [address]  [address] , [place] [address] [phone]  

## 2017-12-28 NOTE — Progress Notes (Signed)
12/28/2017 11:30 AM   Dustin Reese 1945-11-13 [id number]  Referring provider: No referring provider defined for this encounter.  Chief Complaint  Patient presents with  . Nephrolithiasis    HPI: Patient is a 72 year old Caucasian male who underwent emergent right ureteral stent placement due to uncontrollable pain on 12/23/2017 for an obstructing right ureteral stone.  Since the stent placement, he has been having stent discomfort.  He states it is quite bothersome to him and is wanting to have the next procedure scheduled quickly.  He has Flomax and Percocet given to him by th ED.  He has not passed a fragment.   His UA is positive for > 30 RBC's.     PMH: Past Medical History:  Diagnosis Date  . GERD (gastroesophageal reflux disease)   . History of kidney stones   . HTN (hypertension)     Surgical History: Past Surgical History:  Procedure Laterality Date  . BACK SURGERY    . CARPAL TUNNEL RELEASE Bilateral   . EYE SURGERY     Cataract  . TONSILLECTOMY    . URETEROSCOPY WITH HOLMIUM LASER LITHOTRIPSY Right 12/23/2017   Procedure: Right URETEROSCOPY, right retrograde pylogram, right ureteral stent;  Surgeon: Jerilee Field, MD;  Location: [facility] ORS;  Service: Urology;  Laterality: Right;    Home Medications:  Allergies as of 12/28/2017   No Known Allergies     Medication List        Accurate as of 12/28/17 11:59 PM. Always use your most recent med list.          acetaminophen 325 MG tablet Commonly known as:  TYLENOL Take 2 tablets (650 mg total) by mouth every 6 (six) hours as needed for mild pain (or Fever >/= 101).   atenolol 25 MG tablet Commonly known as:  TENORMIN Take 25 mg by mouth daily.   ondansetron 4 MG disintegrating tablet Commonly known as:  ZOFRAN-ODT Take 1 tablet (4 mg total) by mouth every 8 (eight) hours as needed for nausea or vomiting.   oxyCODONE-acetaminophen 10-325 MG tablet Commonly known as:  PERCOCET Take 1 tablet  by mouth every 6 (six) hours as needed for pain.   tamsulosin 0.4 MG Caps capsule Commonly known as:  FLOMAX Take 0.4 mg by mouth daily.   tamsulosin 0.4 MG Caps capsule Commonly known as:  FLOMAX Take 1 capsule (0.4 mg total) by mouth daily after breakfast for 7 days.   temazepam 15 MG capsule Commonly known as:  RESTORIL Take 15 mg by mouth at bedtime as needed for sleep.   vitamin E 400 UNIT capsule Take 400 Units by mouth daily.       Allergies: No Known Allergies  Family History: History reviewed. No pertinent family history.  Social History:  reports that he quit smoking about 50 years ago. His smoking use included cigarettes. He has a 2.50 pack-year smoking history. He quit smokeless tobacco use about 50 years ago.  His smokeless tobacco use included chew. He reports that he drank alcohol. He reports that he does not use drugs.  ROS: UROLOGY Frequent Urination?: No Hard to postpone urination?: No Burning/pain with urination?: No Get up at night to urinate?: No Leakage of urine?: No Urine stream starts and stops?: No Trouble starting stream?: No Do you have to strain to urinate?: No Blood in urine?: No Urinary tract infection?: No Sexually transmitted disease?: No Injury to kidneys or bladder?: No Painful intercourse?: No Weak stream?: No Erection problems?: No  Penile pain?: No  Gastrointestinal Nausea?: No Vomiting?: No Indigestion/heartburn?: No Diarrhea?: No Constipation?: No  Constitutional Fever: No Night sweats?: No Weight loss?: No Fatigue?: No  Skin Skin rash/lesions?: No Itching?: No  Eyes Blurred vision?: No Double vision?: No  Ears/Nose/Throat Sore throat?: No Sinus problems?: No  Hematologic/Lymphatic Swollen glands?: No Easy bruising?: No  Cardiovascular Leg swelling?: No Chest pain?: No  Respiratory Cough?: No Shortness of breath?: No  Endocrine Excessive thirst?: No  Musculoskeletal Back pain?: No Joint  pain?: No  Neurological Headaches?: No Dizziness?: No  Psychologic Depression?: No Anxiety?: No  Physical Exam: BP (!) 169/87 (BP Location: Left Arm, Patient Position: Sitting, Cuff Size: Normal)   Pulse (!) 58   Ht 6\' 1"  (1.854 m)   Wt 203 lb (92.1 kg)   BMI 26.78 kg/m   Constitutional:  Well nourished. Alert and oriented, No acute distress. HEENT: [place] AT, moist mucus membranes.  Trachea midline, no masses. Cardiovascular: No clubbing, cyanosis, or edema. Respiratory: Normal respiratory effort, no increased work of breathing. GI: Abdomen is soft, non tender, non distended, no abdominal masses.  GU: No CVA tenderness.  No bladder fullness or masses.   Skin: No rashes, bruises or suspicious lesions. Lymph: No cervical or inguinal adenopathy. Neurologic: Grossly intact, no focal deficits, moving all 4 extremities. Psychiatric: Normal mood and affect.  Laboratory Data: Lab Results  Component Value Date   WBC 12.6 (H) 01/05/2018   HGB 14.1 01/05/2018   HCT 41.5 01/05/2018   MCV 89.4 01/05/2018   PLT 174 01/05/2018    Lab Results  Component Value Date   CREATININE 1.26 (H) 01/05/2018    No results found for: PSA  No results found for: TESTOSTERONE  No results found for: HGBA1C  No results found for: TSH  No results found for: CHOL, HDL, CHOLHDL, VLDL, LDLCALC  Lab Results  Component Value Date   AST 34 12/22/2017   Lab Results  Component Value Date   ALT 29 12/22/2017   No components found for: ALKALINEPHOPHATASE No components found for: BILIRUBINTOTAL  No results found for: ESTRADIOL  Urinalysis > 30 RBC's.  See Epic.  I have reviewed the labs.   Pertinent Imaging: CLINICAL DATA:  Right flank pain.  History of kidney stones.  EXAM: CT ABDOMEN AND PELVIS WITHOUT CONTRAST  TECHNIQUE: Multidetector CT imaging of the abdomen and pelvis was performed following the standard protocol without IV contrast.  COMPARISON:  CT  09/23/2007  FINDINGS: Lower chest: Unchanged clustered nodules in the right middle lobe, stability over 10 years consistent with benign process.  Hepatobiliary: No focal liver abnormality is seen. No gallstones, gallbladder wall thickening, or biliary dilatation.  Pancreas: No ductal dilatation or inflammation.  Spleen: Normal in size without focal abnormality.  Adrenals/Urinary Tract: Normal adrenal glands. Obstructing 5 x 5 mm stone in the right mid ureter with moderate proximal hydroureteronephrosis and perinephric edema. 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 Two nonobstructing stones in the left kidney. No left hydronephrosis. Left ureter is decompressed. Urinary bladder is physiologically distended. No bladder wall thickening. No bladder stone.  Stomach/Bowel: Moderate diverticulosis of the descending colon. No diverticulitis. No bowel wall thickening, inflammatory change or obstruction. Normal appendix. Stomach is nondistended.  Vascular/Lymphatic: Aortic atherosclerosis and tortuosity. No aneurysm. No enlarged abdominal or pelvic lymph nodes.  Reproductive: Prostate is unremarkable.  Other: Multiple pelvic phleboliths.  No free air or free fluid.  Musculoskeletal: Scoliosis and degenerative change in the lumbar spine. Modic  endplate changes at L3-L4 and L4-L5. There are no acute or suspicious osseous abnormalities.  IMPRESSION: 1. Obstructing 5 mm stone in the right mid ureter with moderate hydronephrosis. 2. Bilateral nonobstructing nephrolithiasis. 3.  Aortic Atherosclerosis (ICD10-I70.0).   Electronically Signed   By: Narda Rutherford M.D.   On: 12/22/2017 05:15 I have independently reviewed the films and a right mid ureteral stone is present with hydronephrosis and a  5 mm stone in the lower pole of the right kidney.  Stones are also present in the left kidney.   Assessment & Plan:    1. Right ureteral  stone Patient is status post right ureteral stent placement Schedule right ureteroscopy with laser lithotripsy and ureteral stent exchange Explained to the patient how the procedure is performed and the risks involved Informed patient that he will have the current stent removed for the procedure and a new one placed during the procedure and will remain in place after the procedure for a short time.  Stent may be removed in the office with a cystoscope or patient may be instructed to remove the stent themselves by the string Patient is familiar with "stent pain"  Residual stones within the kidney or ureter may be present after the procedure and may need to have these addressed at a different encounter Injury to the ureter is the most common intra-operative risk, it may result in an open procedure to correct the defect Infection and bleeding are also risks Explained the risks of general anesthesia, such as: MI, CVA, paralysis, coma and/or death. Advised to contact our office or seek treatment in the ED if becomes febrile or pain/ vomiting are difficult control in order to arrange for emergent/urgent intervention - Urinalysis, Complete - CULTURE, URINE COMPREHENSIVE  2. Right kidney stone Will attempt to address this stone during the scheduled URS   3. Right hydronephrosis  - obtain RUS to ensure the hydronephrosis has resolved once they have passed and/or recovered from procedure to ensure to iatrogenic hydronephrosis remains - it is explained to the patient that it is important to document resolution of the hydronephrosis as "silent hydronephrosis" can occur and cause damage and/or loss of the kidney  4. Microscopic hematuria  - UA today demonstrates > 30 RBC's   - continue to monitor the patient's UA after the treatment/passage of the stone to ensure the hematuria has resolved  - if hematuria persists, we will pursue a hematuria workup with CT Urogram and cystoscopy if  appropriate.   Return for right URS/LL/stent exchange .  These notes generated with voice recognition software. I apologize for typographical errors.  Michiel Cowboy, PA-C  [facility] [address]  [address] [place], [place] [address] [phone]

## 2017-12-31 LAB — CULTURE, URINE COMPREHENSIVE

## 2018-01-01 ENCOUNTER — Telehealth: Payer: Self-pay

## 2018-01-01 MED ORDER — CIPROFLOXACIN HCL 250 MG PO TABS: 250.0000 mg | ORAL_TABLET | Freq: Two times a day (BID) | ORAL | 0 refills | Status: DC

## 2018-01-01 NOTE — Telephone Encounter (Signed)
Patient notified and script sent 

## 2018-01-01 NOTE — Telephone Encounter (Signed)
-----   Message from Harle Battiest, PA-C sent at 01/01/2018  7:53 AM EDT ----- Please let Dustin Reese know that his urine culture grew out a tiny bit of infection.  We would normally not treat this, but he is undergoing instrumentation in the future.  I would like him to start Cipro 250 mg bid for ten days.

## 2018-01-04 ENCOUNTER — Telehealth: Payer: Self-pay | Admitting: Urology

## 2018-01-04 ENCOUNTER — Other Ambulatory Visit: Payer: Self-pay

## 2018-01-04 ENCOUNTER — Encounter
Admission: RE | Admit: 2018-01-04 | Discharge: 2018-01-04 | Disposition: A | Discharge status: Home / Self Care | Payer: Medicare Other | Source: Ambulatory Visit | Attending: Urology | Admitting: Urology

## 2018-01-04 DIAGNOSIS — Z01810 Encounter for preprocedural cardiovascular examination: Secondary | ICD-10-CM | POA: Diagnosis present

## 2018-01-04 DIAGNOSIS — R001 Bradycardia, unspecified: Secondary | ICD-10-CM | POA: Diagnosis not present

## 2018-01-04 HISTORY — DX: Gastro-esophageal reflux disease without esophagitis: K21.9

## 2018-01-04 HISTORY — DX: Personal history of urinary calculi: Z87.442

## 2018-01-04 MED ORDER — TAMSULOSIN HCL 0.4 MG PO CAPS: 0.4000 mg | ORAL_CAPSULE | Freq: Every day | ORAL | 0 refills | Status: DC

## 2018-01-04 NOTE — Telephone Encounter (Signed)
RX sent to pharmacy  

## 2018-01-04 NOTE — Telephone Encounter (Signed)
Pt called stating he is out of Flomax, scheduled for surgery next weds for kidney stones, pt wants to know does he need to continue to take flomax and if so he needs another Rx. Please advise pt at [phone]

## 2018-01-04 NOTE — Patient Instructions (Signed)
Your procedure is scheduled on: Wednesday 01/10/18.  Report to DAY SURGERY DEPARTMENT LOCATED ON 2ND FLOOR MEDICAL MALL ENTRANCE. To find out your arrival time please call [phone] between 1PM - 3PM on Tuesday 01/09/18.  Remember: Instructions that are not followed completely may result in serious medical risk, up to and including death, or upon the discretion of your surgeon and anesthesiologist your surgery may need to be rescheduled.     _X__ 1. Do not eat food after midnight the night before your procedure.                 No gum chewing or hard candies. You may drink clear liquids up to 2 hours                 before you are scheduled to arrive for your surgery- DO NOT drink clear                 liquids within 2 hours of the start of your surgery.                 Clear Liquids include:  water, apple juice without pulp, clear carbohydrate                 drink such as Clearfast or Gatorade, Black Coffee or Tea (Do not add                 anything to coffee or tea).  __X__2.  On the morning of surgery brush your teeth with toothpaste and water, you may rinse your mouth with mouthwash if you wish.  Do not swallow any toothpaste or mouthwash.     _X__ 3.  No Alcohol for 24 hours before or after surgery.   _X__ 4.  Do Not Smoke or use e-cigarettes For 24 Hours Prior to Your Surgery.                 Do not use any chewable tobacco products for at least 6 hours prior to                 surgery.  ____  5.  Bring all medications with you on the day of surgery if instructed.   __X__  6.  Notify your doctor if there is any change in your medical condition      (cold, fever, infections).     Do not wear jewelry, make-up, hairpins, clips or nail polish. Do not wear lotions, powders, or perfumes.  Do not shave 48 hours prior to surgery. Men may shave face and neck. Do not bring valuables to the hospital.    [facility] is not responsible for any belongings or valuables.  Contacts,  dentures/partials or body piercings may not be worn into surgery. Bring a case for your contacts, glasses or hearing aids, a denture cup will be supplied. Leave your suitcase in the car. After surgery it may be brought to your room. For patients admitted to the hospital, discharge time is determined by your treatment team.   Patients discharged the day of surgery will not be allowed to drive home.   Please read over the following fact sheets that you were given:   MRSA Information  __X__ Take these medicines the morning of surgery with A SIP OF WATER:     1. acetaminophen (TYLENOL) 325 MG tablet  2. atenolol (TENORMIN) 25 MG tablet  3. ciprofloxacin (CIPRO) 250 MG tablet  4. omeprazole (PRILOSEC OTC) 20 MG  tablet  5. ondansetron (ZOFRAN ODT) 4 MG disintegrating tablet (if needed)  6. oxyCODONE-acetaminophen (PERCOCET) 10-325 MG (tablet if needed)  * BE CAREFUL TAKING TYLENOL WHILE TAKING PERCOCET BECAUSE PERCOCET CONTAINS TYLENOL    __X__ Use CHG Soap as directed   __X__ Stop Anti-inflammatories 7 days before surgery such as Advil, Ibuprofen, Motrin, BC or Goodies Powder, Naprosyn, Naproxen, Aleve, Aspirin, Meloxicam. May take Tylenol if needed for pain or discomfort.   __X__ Stop all herbal supplements today (vitamin E 400 UNIT capsule)

## 2018-01-05 ENCOUNTER — Other Ambulatory Visit: Payer: Self-pay

## 2018-01-05 ENCOUNTER — Emergency Department
Admission: EM | Admit: 2018-01-05 | Discharge: 2018-01-05 | Disposition: A | Discharge status: Home / Self Care | Payer: Medicare Other | Attending: Emergency Medicine | Admitting: Emergency Medicine

## 2018-01-05 ENCOUNTER — Encounter: Payer: Self-pay | Admitting: Emergency Medicine

## 2018-01-05 ENCOUNTER — Emergency Department: Payer: Medicare Other

## 2018-01-05 DIAGNOSIS — R911 Solitary pulmonary nodule: Secondary | ICD-10-CM | POA: Diagnosis not present

## 2018-01-05 DIAGNOSIS — Z87891 Personal history of nicotine dependence: Secondary | ICD-10-CM | POA: Insufficient documentation

## 2018-01-05 DIAGNOSIS — J181 Lobar pneumonia, unspecified organism: Secondary | ICD-10-CM

## 2018-01-05 DIAGNOSIS — I10 Essential (primary) hypertension: Secondary | ICD-10-CM | POA: Diagnosis not present

## 2018-01-05 DIAGNOSIS — Z79899 Other long term (current) drug therapy: Secondary | ICD-10-CM | POA: Diagnosis not present

## 2018-01-05 DIAGNOSIS — R0789 Other chest pain: Secondary | ICD-10-CM | POA: Diagnosis present

## 2018-01-05 DIAGNOSIS — J189 Pneumonia, unspecified organism: Secondary | ICD-10-CM | POA: Insufficient documentation

## 2018-01-05 DIAGNOSIS — R091 Pleurisy: Secondary | ICD-10-CM | POA: Insufficient documentation

## 2018-01-05 LAB — CBC
HEMATOCRIT: 41.5 % (ref 39.0–52.0)
HEMOGLOBIN: 14.1 g/dL (ref 13.0–17.0)
MCH: 30.4 pg (ref 26.0–34.0)
MCHC: 34 g/dL (ref 30.0–36.0)
MCV: 89.4 fL (ref 80.0–100.0)
Platelets: 174 10*3/uL (ref 150–400)
RBC: 4.64 MIL/uL (ref 4.22–5.81)
RDW: 12.7 % (ref 11.5–15.5)
WBC: 12.6 10*3/uL — AB (ref 4.0–10.5)
nRBC: 0 % (ref 0.0–0.2)

## 2018-01-05 LAB — BASIC METABOLIC PANEL
Anion gap: 7 (ref 5–15)
BUN: 15 mg/dL (ref 8–23)
CALCIUM: 8.9 mg/dL (ref 8.9–10.3)
CO2: 27 mmol/L (ref 22–32)
CREATININE: 1.26 mg/dL — AB (ref 0.61–1.24)
Chloride: 105 mmol/L (ref 98–111)
GFR calc Af Amer: >60 mL/min (ref >60)
GFR, EST NON AFRICAN AMERICAN: 56 mL/min — AB (ref >60)
GLUCOSE: 114 mg/dL — AB (ref 70–99)
POTASSIUM: 4 mmol/L (ref 3.5–5.1)
SODIUM: 139 mmol/L (ref 135–145)

## 2018-01-05 LAB — TROPONIN I
Troponin I: <0.03 ng/mL (ref <0.03)
Troponin I: <0.03 ng/mL (ref <0.03)

## 2018-01-05 MED ORDER — AZITHROMYCIN 250 MG PO TABS: ORAL_TABLET | ORAL | 0 refills | Status: DC

## 2018-01-05 MED ORDER — AZITHROMYCIN 500 MG PO TABS
500.0000 mg | ORAL_TABLET | Freq: Once | ORAL | Status: AC
  Administered 2018-01-05: 500 mg via ORAL
  Filled 2018-01-05: qty 1

## 2018-01-05 MED ORDER — IOPAMIDOL (ISOVUE-370) INJECTION 76%
75.0000 mL | Freq: Once | INTRAVENOUS | Status: AC | PRN
  Administered 2018-01-05: 75 mL via INTRAVENOUS

## 2018-01-05 MED ORDER — ACETAMINOPHEN 500 MG PO TABS
1000.0000 mg | ORAL_TABLET | ORAL | Status: AC
  Administered 2018-01-05: 1000 mg via ORAL
  Filled 2018-01-05: qty 2

## 2018-01-05 NOTE — ED Triage Notes (Signed)
Pt to ED with left chest pain x3 days and has gotten worse, hurts with breathing, denies n/v/d, denies radiation.  Pt A&Ox4, speaking in complete and coherent sentences, chest rise even and unlabored, in NAD at this time.

## 2018-01-05 NOTE — ED Provider Notes (Signed)
[facility] Emergency Department Provider Note  ____________________________________________   First MD Initiated Contact with Patient 01/05/18 1551     (approximate)  I have reviewed the triage vital signs and the nursing notes.   HISTORY  Chief Complaint Chest Pain    HPI Dustin Reese is a 72 y.o. male presents for evaluation of sharp pain in the left chest for 3 days  Patient reports he has been expensing a very sharp stabbing pain just below the nipple on the left chest when he takes a deep breath for 3 days.  Reports he had something similar may be a year or 2 ago that was called "pleurisy" got better.  No history of blood clots.  No fevers or chills.  No shortness of breath or coughing.  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  Denies any radiation to the neck back or arm.  Does not feel like a heavy pressure but rather feels like a very sharp catching stabbing feeling in the left chest.  No abdominal pain.  Does have kidney stones which have been getting better and he had stent placed and has a plan to have it removed Wednesday but was told not to take any ibuprofen or aspirin.  He has been occasionally having the use of Percocet for which she took one last night because the pain in his left chest.   Past Medical History:  Diagnosis Date  . GERD (gastroesophageal reflux disease)   . History of kidney stones   . HTN (hypertension)     Patient Active Problem List   Diagnosis Date Noted  . Right kidney stone 12/23/2017  . Ureteral stone 12/22/2017    Past Surgical History:  Procedure Laterality Date  . BACK SURGERY    . CARPAL TUNNEL RELEASE Bilateral   . EYE SURGERY     Cataract  . TONSILLECTOMY    . URETEROSCOPY WITH HOLMIUM LASER LITHOTRIPSY Right 12/23/2017   Procedure: Right URETEROSCOPY, right retrograde pylogram, right ureteral stent;  Surgeon: Jerilee Field, MD;  Location: [facility] ORS;  Service: Urology;   Laterality: Right;    Prior to Admission medications   Medication Sig Start Date End Date Taking? Authorizing Provider  acetaminophen (TYLENOL) 325 MG tablet Take 2 tablets (650 mg total) by mouth every 6 (six) hours as needed for mild pain (or Fever >/= 101). 12/23/17   Gouru, Deanna Artis, MD  atenolol (TENORMIN) 25 MG tablet Take 25 mg by mouth daily. 09/28/17   [provider]  azithromycin (ZITHROMAX) 250 MG tablet Take 1 tablet by mouth daily 01/05/18   Sharyn Creamer, MD  ciprofloxacin (CIPRO) 250 MG tablet Take 1 tablet (250 mg total) by mouth 2 (two) times daily. 01/01/18   Michiel Cowboy A, PA-C  omeprazole (PRILOSEC OTC) 20 MG tablet Take 20 mg by mouth daily.    [provider]  ondansetron (ZOFRAN ODT) 4 MG disintegrating tablet Take 1 tablet (4 mg total) by mouth every 8 (eight) hours as needed for nausea or vomiting. 12/23/17   Ramonita Lab, MD  oxyCODONE-acetaminophen (PERCOCET) 10-325 MG tablet Take 1 tablet by mouth every 6 (six) hours as needed for pain. 12/23/17 12/23/18  Ramonita Lab, MD  tamsulosin (FLOMAX) 0.4 MG CAPS capsule Take 1 capsule (0.4 mg total) by mouth daily. 01/04/18   Vanna Scotland, MD  temazepam (RESTORIL) 15 MG capsule Take 15 mg by mouth at bedtime as needed for sleep.  12/18/17   [provider]  vitamin E 400 UNIT capsule Take 400 Units by mouth daily.    [provider]    Allergies Patient has no known allergies.  History reviewed. No pertinent family history.  Social History Social History   Tobacco Use  . Smoking status: Former Smoker    Packs/day: 0.50    Years: 5.00    Pack years: 2.50    Types: Cigarettes    Last attempt to quit: 1969    Years since quitting: 50.8  . Smokeless tobacco: Former Neurosurgeon    Types: Chew    Quit date: 03/22/1967  Substance Use Topics  . Alcohol use: Not Currently  . Drug use: Never    Review of Systems Constitutional: No fever/chills Eyes: No visual changes. ENT: No sore  throat. Cardiovascular: See HPI Respiratory: Denies shortness of breath. Gastrointestinal: No abdominal pain.   Genitourinary: Negative for dysuria. Musculoskeletal: Negative for back pain. Skin: Negative for rash. Neurological: Negative for headaches, areas of focal weakness or numbness.    ____________________________________________   PHYSICAL EXAM:  VITAL SIGNS: ED Triage Vitals  Enc Vitals Group     BP 01/05/18 1206 (!) 141/70     Pulse Rate 01/05/18 1206 63     Resp 01/05/18 1206 16     Temp 01/05/18 1206 97.8 F (36.6 C)     Temp Source 01/05/18 1206 Oral     SpO2 01/05/18 1206 97 %     Weight 01/05/18 1203 200 lb (90.7 kg)     Height 01/05/18 1203 6\' 1"  (1.854 m)     Head Circumference --      Peak Flow --      Pain Score 01/05/18 1202 7     Pain Loc --      Pain Edu? --      Excl. in GC? --     Constitutional: Alert and oriented. Well appearing and in no acute distress. Eyes: Conjunctivae are normal. Head: Atraumatic. Nose: No congestion/rhinnorhea. Mouth/Throat: Mucous membranes are moist. Neck: No stridor.  Cardiovascular: Normal rate, regular rhythm. Grossly normal heart sounds.  Good peripheral circulation. Respiratory: Normal respiratory effort.  No retractions. Lungs CTAB.  Patient does have a pleuritic pain noted when taste deep breath he reports just below the left nipple. Gastrointestinal: Soft and nontender. No distention. Musculoskeletal: No lower extremity tenderness nor edema.  No venous cords or congestion.  No thigh tenderness.  No edema in either foot or lower leg. Neurologic:  Normal speech and language. No gross focal neurologic deficits are appreciated.  Skin:  Skin is warm, dry and intact. No rash noted. Psychiatric: Mood and affect are normal. Speech and behavior are normal.  ____________________________________________   LABS (all labs ordered are listed, but only abnormal results are displayed)  Labs Reviewed  BASIC METABOLIC  PANEL - Abnormal; Notable for the following components:      Result Value   Glucose, Bld 114 (*)    Creatinine, Ser 1.26 (*)    GFR calc non Af Amer 56 (*)    All other components within normal limits  CBC - Abnormal; Notable for the following components:   WBC 12.6 (*)    All other components within normal limits  TROPONIN I  TROPONIN I  TROPONIN I   ____________________________________________  EKG  ED ECG REPORT I, Sharyn Creamer, the attending physician, personally viewed and interpreted this ECG.  Date: 01/05/2018 EKG Time: Noon Rate: 60 Rhythm: normal sinus rhythm QRS Axis: normal Intervals: normal  ST/T Wave abnormalities: normal Narrative Interpretation: no evidence of acute ischemia  ____________________________________________  RADIOLOGY  CT scan reviewed by me   CT scan reviewed, no PE.  Small pleural effusion, some opacity in the lingula may reflect pneumonia.  Given the slight leukocytosis and development of atypical left-sided pleuritic pain I suspect this may be a small clinical pneumonia causing pleurisy.  Additionally, did discuss with the patient the finding of a nodule he is a previous smoker, will follow up with his primary care doctor on this as well. ____________________________________________   PROCEDURES  Procedure(s) performed: None  Procedures  Critical Care performed: No  ____________________________________________   INITIAL IMPRESSION / ASSESSMENT AND PLAN / ED COURSE  Pertinent labs & imaging results that were available during my care of the patient were reviewed by me and considered in my medical decision making (see chart for details).   Chest pain, very pleuritic in nature.  Very atypical for acute coronary syndrome, EKG and first troponin after expensing pain for 3 days is normal.  No evidence of acute cardiac ischemia.  However, on chest x-ray he does have a small pleural effusion overlying the left lower lung I suspect this is  hinting out an etiology for his pain today.  We will proceed by obtaining a CT scan to exclude pulmonary embolism and further evaluate for etiology of pleuritic pain.  He denies infectious symptoms or shortness of breath at this time.    Trop #2 within normal  ----------------------------------------- 6:08 PM on 01/05/2018 -----------------------------------------  Patient resting comfortably, normal vital signs and mental status.  No hypoxia.  Patient comfortable with plan for discharge, antibiotics and careful return precautions discussed.  Follow-up closely with primary care doctor.  Patient did have a very brief hospitalization for less than 48 hours for kidney stone, patient is not felt to be high risk for healthcare associated pneumonia  Return precautions and treatment recommendations and follow-up discussed with the patient who is agreeable with the plan.       ____________________________________________   FINAL CLINICAL IMPRESSION(S) / ED DIAGNOSES  Final diagnoses:  Pulmonary nodule  Pleurisy  Community acquired pneumonia of left lower lobe of lung (HCC)        Note:  This document was prepared using Sales executive software and may include unintentional dictation errors       Sharyn Creamer, MD 01/05/18 1809

## 2018-01-05 NOTE — Discharge Instructions (Addendum)
We believe that your symptoms are caused today by pneumonia, an infection in your lung(s).  Fortunately you should start to improve quickly after taking your antibiotics.  Please take the full course of antibiotics as prescribed and drink plenty of fluids.    Follow up with your doctor within 3 days.  If you develop any new or worsening symptoms, including but not limited to fever in spite of taking over-the-counter ibuprofen and/or Tylenol, persistent vomiting, worsening shortness of breath, or other symptoms that concern you, please return to the Emergency Department immediately.   Please also follow-up with your doctor regarding the nodule seen on your CAT scan.

## 2018-01-08 ENCOUNTER — Telehealth: Payer: Self-pay | Admitting: Radiology

## 2018-01-08 ENCOUNTER — Telehealth: Payer: Self-pay | Admitting: Urology

## 2018-01-08 NOTE — Telephone Encounter (Signed)
Notified Cherie in pre-admission testing that procedure can be done under spinal if needed per Dr Apolinar Junes.

## 2018-01-08 NOTE — Telephone Encounter (Signed)
Procedure can be done under spinal if needed for distal stone.  Vanna Scotland, MD

## 2018-01-08 NOTE — Telephone Encounter (Signed)
Result Notes for CULTURE, URINE COMPREHENSIVE   Notes recorded by Harle Battiest, PA-C on 01/01/2018 at 7:53 AM EDT Please let Mr. Jurgens know that his urine culture grew out a tiny bit of infection. We would normally not treat this, but he is undergoing instrumentation in the future. I would like him to start Cipro 250 mg bid for ten days.

## 2018-01-08 NOTE — Telephone Encounter (Signed)
FYI:  Dustin Reese was seen at [facility] ED on 01/05/2018 ER with for chest pain.  He was diagnosed with pneumonia.  He is schedule for URS on 10/23.

## 2018-01-08 NOTE — OR Nursing (Signed)
Spoke to Dr Maisie Fus regarding patient upcoming surgery and recent visit to ER. He suggest patient see his PCP and get medical clearance. Info forwarded to Amy at [facility].

## 2018-01-08 NOTE — Telephone Encounter (Signed)
Mr. Bearse was seen at [facility] ED on 01/05/2018 ER with for chest pain.  He was diagnosed with pneumonia.  He is schedule for URS on 10/23.   Explained to patient that he will need surgical clearance prior to surgery. Appointment made at [facility]. Clearance faxed to [place] at [phone]. Patient's questions were answered. Patient voices understanding.

## 2018-01-09 MED ORDER — CEFAZOLIN SODIUM-DEXTROSE 2-4 GM/100ML-% IV SOLN
2.0000 g | INTRAVENOUS | Status: AC
  Administered 2018-01-10: 2 g via INTRAVENOUS

## 2018-01-09 NOTE — Telephone Encounter (Signed)
Surgical clearance obtained from PCP. Ok to proceed with surgery scheduled 01/10/2018.

## 2018-01-10 ENCOUNTER — Ambulatory Visit: Payer: Medicare Other | Admitting: Anesthesiology

## 2018-01-10 ENCOUNTER — Other Ambulatory Visit: Payer: Self-pay

## 2018-01-10 ENCOUNTER — Encounter
Admission: RE | Disposition: A | Discharge status: Home / Self Care | Payer: Self-pay | Source: Ambulatory Visit | Attending: Urology

## 2018-01-10 ENCOUNTER — Encounter: Payer: Self-pay | Admitting: Emergency Medicine

## 2018-01-10 ENCOUNTER — Ambulatory Visit
Admission: RE | Admit: 2018-01-10 | Discharge: 2018-01-10 | Disposition: A | Discharge status: Home / Self Care | Payer: Medicare Other | Source: Ambulatory Visit | Attending: Urology | Admitting: Urology

## 2018-01-10 DIAGNOSIS — N132 Hydronephrosis with renal and ureteral calculous obstruction: Secondary | ICD-10-CM | POA: Diagnosis present

## 2018-01-10 DIAGNOSIS — N201 Calculus of ureter: Secondary | ICD-10-CM

## 2018-01-10 DIAGNOSIS — Z87442 Personal history of urinary calculi: Secondary | ICD-10-CM | POA: Diagnosis not present

## 2018-01-10 DIAGNOSIS — I10 Essential (primary) hypertension: Secondary | ICD-10-CM | POA: Insufficient documentation

## 2018-01-10 DIAGNOSIS — N200 Calculus of kidney: Secondary | ICD-10-CM

## 2018-01-10 DIAGNOSIS — Z79899 Other long term (current) drug therapy: Secondary | ICD-10-CM | POA: Insufficient documentation

## 2018-01-10 DIAGNOSIS — Z87891 Personal history of nicotine dependence: Secondary | ICD-10-CM | POA: Insufficient documentation

## 2018-01-10 HISTORY — PX: CYSTOSCOPY/URETEROSCOPY/HOLMIUM LASER/STENT PLACEMENT: SHX6546

## 2018-01-10 SURGERY — CYSTOSCOPY/URETEROSCOPY/HOLMIUM LASER/STENT PLACEMENT
Anesthesia: General | Site: Ureter | Laterality: Right

## 2018-01-10 MED ORDER — PROPOFOL 10 MG/ML IV BOLUS
INTRAVENOUS | Status: DC | PRN
  Administered 2018-01-10: 200 mg via INTRAVENOUS

## 2018-01-10 MED ORDER — CEFAZOLIN SODIUM-DEXTROSE 2-4 GM/100ML-% IV SOLN
INTRAVENOUS | Status: AC
  Filled 2018-01-10: qty 100

## 2018-01-10 MED ORDER — OXYCODONE HCL 5 MG/5ML PO SOLN: 5.0000 mg | Freq: Once | ORAL | Status: DC | PRN

## 2018-01-10 MED ORDER — PROMETHAZINE HCL 25 MG/ML IJ SOLN: 6.2500 mg | INTRAMUSCULAR | Status: DC | PRN

## 2018-01-10 MED ORDER — IOPAMIDOL (ISOVUE-200) INJECTION 41%
INTRAVENOUS | Status: DC | PRN
  Administered 2018-01-10: 15 mL

## 2018-01-10 MED ORDER — DEXAMETHASONE SODIUM PHOSPHATE 10 MG/ML IJ SOLN
INTRAMUSCULAR | Status: DC | PRN
  Administered 2018-01-10: 10 mg via INTRAVENOUS

## 2018-01-10 MED ORDER — OXYCODONE-ACETAMINOPHEN 10-325 MG PO TABS: 1.0000 | ORAL_TABLET | Freq: Four times a day (QID) | ORAL | 0 refills | Status: DC | PRN

## 2018-01-10 MED ORDER — LACTATED RINGERS IV SOLN
INTRAVENOUS | Status: DC
  Administered 2018-01-10: 12:00:00 via INTRAVENOUS

## 2018-01-10 MED ORDER — OXYCODONE HCL 5 MG PO TABS: 5.0000 mg | ORAL_TABLET | Freq: Once | ORAL | Status: DC | PRN

## 2018-01-10 MED ORDER — SUCCINYLCHOLINE CHLORIDE 20 MG/ML IJ SOLN
INTRAMUSCULAR | Status: DC | PRN
  Administered 2018-01-10: 120 mg via INTRAVENOUS

## 2018-01-10 MED ORDER — PROPOFOL 10 MG/ML IV BOLUS
INTRAVENOUS | Status: AC
  Filled 2018-01-10: qty 20

## 2018-01-10 MED ORDER — FENTANYL CITRATE (PF) 100 MCG/2ML IJ SOLN: 25.0000 ug | INTRAMUSCULAR | Status: DC | PRN

## 2018-01-10 MED ORDER — ROCURONIUM BROMIDE 100 MG/10ML IV SOLN
INTRAVENOUS | Status: DC | PRN
  Administered 2018-01-10: 10 mg via INTRAVENOUS
  Administered 2018-01-10: 40 mg via INTRAVENOUS

## 2018-01-10 MED ORDER — SUGAMMADEX SODIUM 500 MG/5ML IV SOLN
INTRAVENOUS | Status: DC | PRN
  Administered 2018-01-10: 181.4 mg via INTRAVENOUS

## 2018-01-10 MED ORDER — MIDAZOLAM HCL 2 MG/2ML IJ SOLN
INTRAMUSCULAR | Status: DC | PRN
  Administered 2018-01-10: 2 mg via INTRAVENOUS

## 2018-01-10 MED ORDER — ONDANSETRON HCL 4 MG/2ML IJ SOLN
INTRAMUSCULAR | Status: DC | PRN
  Administered 2018-01-10: 4 mg via INTRAVENOUS

## 2018-01-10 MED ORDER — MEPERIDINE HCL 50 MG/ML IJ SOLN: 6.2500 mg | INTRAMUSCULAR | Status: DC | PRN

## 2018-01-10 MED ORDER — MIDAZOLAM HCL 2 MG/2ML IJ SOLN
INTRAMUSCULAR | Status: AC
  Filled 2018-01-10: qty 2

## 2018-01-10 MED ORDER — FENTANYL CITRATE (PF) 100 MCG/2ML IJ SOLN
INTRAMUSCULAR | Status: AC
  Filled 2018-01-10: qty 2

## 2018-01-10 MED ORDER — LIDOCAINE HCL (CARDIAC) PF 100 MG/5ML IV SOSY
PREFILLED_SYRINGE | INTRAVENOUS | Status: DC | PRN
  Administered 2018-01-10: 100 mg via INTRAVENOUS

## 2018-01-10 MED ORDER — FENTANYL CITRATE (PF) 100 MCG/2ML IJ SOLN
INTRAMUSCULAR | Status: DC | PRN
  Administered 2018-01-10: 25 ug via INTRAVENOUS
  Administered 2018-01-10: 50 ug via INTRAVENOUS
  Administered 2018-01-10: 25 ug via INTRAVENOUS

## 2018-01-10 SURGICAL SUPPLY — 28 items
BAG DRAIN CYSTO-URO LG1000N (MISCELLANEOUS) ×3 IMPLANT
BASKET ZERO TIP 1.9FR (BASKET) ×3 IMPLANT
BRUSH SCRUB EZ 1% IODOPHOR (MISCELLANEOUS) ×3 IMPLANT
CATH URETL 5X70 OPEN END (CATHETERS) ×3 IMPLANT
CNTNR SPEC 2.5X3XGRAD LEK (MISCELLANEOUS) ×1
CONT SPEC 4OZ STER OR WHT (MISCELLANEOUS) ×2
CONTAINER SPEC 2.5X3XGRAD LEK (MISCELLANEOUS) ×1 IMPLANT
DRAPE UTILITY 15X26 TOWEL STRL (DRAPES) ×3 IMPLANT
FIBER LASER LITHO 273 (Laser) ×3 IMPLANT
GLOVE BIO SURGEON STRL SZ 6.5 (GLOVE) ×2 IMPLANT
GLOVE BIO SURGEONS STRL SZ 6.5 (GLOVE) ×1
GOWN STRL REUS W/ TWL LRG LVL3 (GOWN DISPOSABLE) ×2 IMPLANT
GOWN STRL REUS W/TWL LRG LVL3 (GOWN DISPOSABLE) ×4
GUIDEWIRE GREEN .038 145CM (MISCELLANEOUS) ×3 IMPLANT
INFUSOR MANOMETER BAG 3000ML (MISCELLANEOUS) ×3 IMPLANT
INTRODUCER DILATOR DOUBLE (INTRODUCER) IMPLANT
KIT TURNOVER CYSTO (KITS) ×3 IMPLANT
PACK CYSTO AR (MISCELLANEOUS) ×3 IMPLANT
SENSORWIRE 0.038 NOT ANGLED (WIRE) ×3
SET CYSTO W/LG BORE CLAMP LF (SET/KITS/TRAYS/PACK) ×3 IMPLANT
SHEATH URETERAL 12FRX35CM (MISCELLANEOUS) ×3 IMPLANT
SOL .9 NS 3000ML IRR  AL (IV SOLUTION) ×2
SOL .9 NS 3000ML IRR UROMATIC (IV SOLUTION) ×1 IMPLANT
STENT URET 6FRX24 CONTOUR (STENTS) IMPLANT
STENT URET 6FRX26 CONTOUR (STENTS) ×3 IMPLANT
SURGILUBE 2OZ TUBE FLIPTOP (MISCELLANEOUS) ×3 IMPLANT
WATER STERILE IRR 1000ML POUR (IV SOLUTION) ×3 IMPLANT
WIRE SENSOR 0.038 NOT ANGLED (WIRE) ×1 IMPLANT

## 2018-01-10 NOTE — Interval H&P Note (Signed)
History and Physical Interval Note:  01/10/2018 12:13 PM  Dustin Reese  has presented today for surgery, with the diagnosis of right ureteral stone, right renal stone  The various methods of treatment have been discussed with the patient and family. After consideration of risks, benefits and other options for treatment, the patient has consented to  Procedure(s): CYSTOSCOPY/URETEROSCOPY/HOLMIUM LASER/STENT Exchange (Right) as a surgical intervention .  The patient's history has been reviewed, patient examined, no change in status, stable for surgery.  I have reviewed the patient's chart and labs.  Questions were answered to the patient's satisfaction.    RRR CTAB  Passed ureteral stone.  Discussed option for stent removal vs. Treating right sided nonobstructing stone.  Opted for the latter.    Vanna Scotland, MD   Vanna Scotland

## 2018-01-10 NOTE — Transfer of Care (Signed)
Immediate Anesthesia Transfer of Care Note  Patient: Dustin Reese  Procedure(s) Performed: CYSTOSCOPY/URETEROSCOPY/HOLMIUM LASER/STENT Exchange (Right Ureter)  Patient Location: PACU  Anesthesia Type:General  Level of Consciousness: awake, alert  and oriented  Airway & Oxygen Therapy: Patient Spontanous Breathing and Patient connected to face mask oxygen  Post-op Assessment: Report given to RN and Post -op Vital signs reviewed and stable  Post vital signs: Reviewed and stable  Last Vitals:  Vitals Value Taken Time  BP 152/87 01/10/2018  1:37 PM  Temp 36.2 C 01/10/2018  1:37 PM  Pulse 73 01/10/2018  1:38 PM  Resp 14 01/10/2018  1:38 PM  SpO2 98 % 01/10/2018  1:38 PM  Vitals shown include unvalidated device data.  Last Pain:  Vitals:   01/10/18 1131  TempSrc: Temporal  PainSc: 0-No pain         Complications: No apparent anesthesia complications

## 2018-01-10 NOTE — Discharge Instructions (Addendum)
You have a ureteral stent in place.  This is a tube that extends from your kidney to your bladder.  This may cause urinary bleeding, burning with urination, and urinary frequency.  Please call our office or present to the ED if you develop fevers >101 or pain which is not able to be controlled with oral pain medications.  You may be given either Flomax and/ or ditropan to help with bladder spasms and stent pain in addition to pain medications.    Your stent is on a string.  Its taped to the head of your penis.  In 3 days, you may untape and pull gently until the entire stent is removed.  Soaking in a tub may help to loosen the sticker.  If you have any difficulties, please call our office.  If you are not comfortable removing it yourself, you may call the office to arrange for a nurse visit to have it removed.  [facility] [address] [place], [place] [address] [phone]    AMBULATORY SURGERY  DISCHARGE INSTRUCTIONS   1) The drugs that you were given will stay in your system until tomorrow so for the next 24 hours you should not:  A) Drive an automobile B) Make any legal decisions C) Drink any alcoholic beverage   2) You may resume regular meals tomorrow.  Today it is better to start with liquids and gradually work up to solid foods.  You may eat anything you prefer, but it is better to start with liquids, then soup and crackers, and gradually work up to solid foods.   3) Please notify your doctor immediately if you have any unusual bleeding, trouble breathing, redness and pain at the surgery site, drainage, fever, or pain not relieved by medication.    4) Additional Instructions:        Please contact your physician with any problems or Same Day Surgery at [phone], Monday through Friday 6 am to 4 pm, or Vivian at [facility] number at [phone].

## 2018-01-10 NOTE — Anesthesia Postprocedure Evaluation (Signed)
Anesthesia Post Note  Patient: Dustin Reese  Procedure(s) Performed: CYSTOSCOPY/URETEROSCOPY/HOLMIUM LASER/STENT Exchange (Right Ureter)  Patient location during evaluation: PACU Anesthesia Type: General Level of consciousness: awake and alert Pain management: pain level controlled Vital Signs Assessment: post-procedure vital signs reviewed and stable Respiratory status: spontaneous breathing, nonlabored ventilation, respiratory function stable and patient connected to nasal cannula oxygen Cardiovascular status: blood pressure returned to baseline and stable Postop Assessment: no apparent nausea or vomiting Anesthetic complications: no     Last Vitals:  Vitals:   01/10/18 1402 01/10/18 1414  BP: (!) 160/82 (!) 179/90  Pulse: 64 68  Resp: 15 16  Temp:  (!) 36.3 C  SpO2: 100% 99%    Last Pain:  Vitals:   01/10/18 1414  TempSrc: Temporal  PainSc: 0-No pain                 Janay Canan S

## 2018-01-10 NOTE — Anesthesia Post-op Follow-up Note (Signed)
Anesthesia QCDR form completed.        

## 2018-01-10 NOTE — Op Note (Signed)
Date of procedure: 01/10/18  Preoperative diagnosis:  1. Right kidney stone  Postoperative diagnosis:  1. Same as above  Procedure: 1. Right ureteroscopy 2. Laser lithotripsy 3. Right ureteral stent exchange 4. Right retrograde pyelogram  Surgeon: Vanna Scotland, MD  Anesthesia: General  Complications: None  Intraoperative findings: 5 mm and punctate stone treated in the upper tract.  No ureteral stone identified.  Stent exchanged placed on tether.  EBL: Minimal  Specimens: Stone fragment  Drains: 6 x 26 French double-J ureteral stent on right, tether left in place  Indication: Dustin Reese is a 72 y.o. patient with an obstructing 5 mm right ureteral stone who underwent urgent ureteral stent placement for pain control.  Notably in the preoperative holding area, he reports he passed the stone 2 days ago and brings with him today.  We did review the option of conservative management for his 5 mm nonobstructing upper pole stone versus pursuing ureteroscopy today to clear the stone and he elected to pursue the latter after reviewing the management options for treatment, he elected to proceed with the above surgical procedure(s). We have discussed the potential benefits and risks of the procedure, side effects of the proposed treatment, the likelihood of the patient achieving the goals of the procedure, and any potential problems that might occur during the procedure or recuperation. Informed consent has been obtained.  Description of procedure:  The patient was taken to the operating room and general anesthesia was induced.  The patient was placed in the dorsal lithotomy position, prepped and draped in the usual sterile fashion, and preoperative antibiotics were administered. A preoperative time-out was performed.   A 21 French scope was advanced per urethra into the bladder.  Attention was turned to the right ureteral orifice from which a ureteral stent was seen emanating.  The distal  coil of the stent was grasped brought to level urethral meatus.  The stent was then cannulated using a sensor wire up to level the kidney.  A dual-lumen access sheath was then used to introduce a Super Stiff wire up to level of the kidney as well.  The safety wire/sensor wire was snapped in place and the Super Stiff wire was used as a working wire.  A Cook 02/1444 cm ureteral access sheath was advanced under fluoroscopic guidance up to level the proximal ureter.  The inner cannula was removed.  A dual-lumen 8 French Wolf ureteroscope was then advanced up to level of the upper tract and each and every calyx was visualized.  A 5 mm stone was identified within 1 of the lower pole calyces.  There is also a punctate stone in the mid upper pole.  A 273 m laser fiber was then brought in and using settings of 0.2 J and 40 Hz to completely fragment the stone into tiny particles.  No significant fragments were remaining.  Contrast was injected through the scope to create a retrograde pyelogram roadmap of the kidney to ensure that each every calyx was directly visualized.  Once the upper tract was adequately cleared, the scope was slowly backed down the length the ureter inspecting along the way.  No stones were identified and the ureter was unharmed.  The safety wire was then backloaded over rigid cystoscope and a 6 x 26 French double-J ureteral stent was advanced over the wire up to level the renal pelvis.  The wires partially drawn until focal stone within the renal pelvis and then ultimately withdrawn a full coils over the bladder.  The stent string was left affixed to the distal coil and secured to the patient's glans using Mastisol and Tegaderm.  He was then clean and dry, repositioned the supine position, reversed anesthesia, taken the PACU in stable condition.  Plan: Patient will remove his own stent in 3 days.  He will follow-up in 4 weeks with a renal ultrasound prior.  Vanna Scotland, M.D.

## 2018-01-10 NOTE — Anesthesia Preprocedure Evaluation (Signed)
Anesthesia Evaluation  Patient identified by MRN, date of birth, ID band Patient awake    Reviewed: Allergy & Precautions, NPO status , Patient's Chart, lab work & pertinent test results  History of Anesthesia Complications Negative for: history of anesthetic complications  Airway Mallampati: II  TM Distance: >3 FB Neck ROM: Full    Dental  (+) Caps   Pulmonary neg sleep apnea, neg COPD, former smoker,    breath sounds clear to auscultation- rhonchi (-) wheezing      Cardiovascular Exercise Tolerance: Good hypertension, Pt. on medications (-) CAD, (-) Past MI, (-) Cardiac Stents and (-) CABG  Rhythm:Regular Rate:Normal - Systolic murmurs and - Diastolic murmurs    Neuro/Psych negative neurological ROS  negative psych ROS   GI/Hepatic Neg liver ROS, GERD  ,  Endo/Other  negative endocrine ROSneg diabetes  Renal/GU Renal disease: nephrolithiasis.     Musculoskeletal negative musculoskeletal ROS (+)   Abdominal (+) - obese,   Peds  Hematology negative hematology ROS (+)   Anesthesia Other Findings Past Medical History: No date: GERD (gastroesophageal reflux disease) No date: History of kidney stones No date: HTN (hypertension)   Reproductive/Obstetrics                             Anesthesia Physical Anesthesia Plan  ASA: II  Anesthesia Plan: General   Post-op Pain Management:    Induction: Intravenous  PONV Risk Score and Plan: 1 and Ondansetron and Midazolam  Airway Management Planned: Oral ETT  Additional Equipment:   Intra-op Plan:   Post-operative Plan: Extubation in OR  Informed Consent: I have reviewed the patients History and Physical, chart, labs and discussed the procedure including the risks, benefits and alternatives for the proposed anesthesia with the patient or authorized representative who has indicated his/her understanding and acceptance.   Dental advisory  given  Plan Discussed with: CRNA and Anesthesiologist  Anesthesia Plan Comments:         Anesthesia Quick Evaluation

## 2018-01-10 NOTE — Anesthesia Procedure Notes (Signed)

## 2018-01-11 ENCOUNTER — Telehealth: Payer: Self-pay | Admitting: Urology

## 2018-01-11 ENCOUNTER — Encounter: Payer: Self-pay | Admitting: Urology

## 2018-01-11 NOTE — Telephone Encounter (Signed)
Patient notified and voiced understanding.

## 2018-01-11 NOTE — Telephone Encounter (Signed)
Medication that was given is making him sick, wants to have another medication called in to help with the burning.   oxyCODONE-acetaminophen (PERCOCET) 10-325 MG tablet [[id number]]

## 2018-01-11 NOTE — Telephone Encounter (Signed)
Patient needs a different Medication for pain. Please advise

## 2018-01-11 NOTE — Telephone Encounter (Signed)
If his issue is burning, have him try pyridium (azo).  Dustin Reese

## 2018-01-18 LAB — STONE ANALYSIS
CA OXALATE, MONOHYDR.: 95 %
Ca phos cry stone ql IR: 5 %
Stone Weight KSTONE: 29.5 mg

## 2018-02-01 ENCOUNTER — Ambulatory Visit
Admission: RE | Admit: 2018-02-01 | Discharge: 2018-02-01 | Disposition: A | Discharge status: Home / Self Care | Payer: Medicare Other | Source: Ambulatory Visit | Attending: Urology | Admitting: Urology

## 2018-02-01 DIAGNOSIS — N200 Calculus of kidney: Secondary | ICD-10-CM

## 2018-02-07 NOTE — Progress Notes (Signed)
02/08/2018 9:30 AM   Dustin Reese 1945/07/31 [id number]  Referring provider: [facility] [address] [place], [place] [address]  Chief Complaint  Patient presents with  . Nephrolithiasis    4wk w/RUS    HPI: Dustin Reese is a 72 yo Caucasian male who returns today follow up.Marland Kitchen His last visit with [place] on 12/28/2017.  He underwent emergent right ureteral stent placement due to uncontrollable pain on 12/23/2017 for an obstructing right ureteral stone. He then returned to the operating room 01/10/18 for a ureteroscopy which was uncomplicated removed his own stent 3 days post-op.   Follow-up RUS shows no hydronephrosis and shows non-obstructing 4 mm stone on the left.   Stone composition is 95% Calcium oxalate monohydrate 5% Calcium phosphate  He has a personal history of passing a stone several years ago and never required surgery.   Patient denies any gross hematuria, dysuria, burning or flank pain. Pt denies drinking soda but drinks Monster energy drinks bid. He is willing to drink a lots of water and follow a stone diet.   PMH: Past Medical History:  Diagnosis Date  . GERD (gastroesophageal reflux disease)   . History of kidney stones   . HTN (hypertension)     Surgical History: Past Surgical History:  Procedure Laterality Date  . BACK SURGERY    . CARPAL TUNNEL RELEASE Bilateral   . CYSTOSCOPY/URETEROSCOPY/HOLMIUM LASER/STENT PLACEMENT Right 01/10/2018   Procedure: CYSTOSCOPY/URETEROSCOPY/HOLMIUM LASER/STENT Exchange;  Surgeon: Vanna Scotland, MD;  Location: [facility] ORS;  Service: Urology;  Laterality: Right;  . EYE SURGERY     Cataract  . TONSILLECTOMY    . URETEROSCOPY WITH HOLMIUM LASER LITHOTRIPSY Right 12/23/2017   Procedure: Right URETEROSCOPY, right retrograde pylogram, right ureteral stent;  Surgeon: Jerilee Field, MD;  Location: [facility] ORS;  Service: Urology;  Laterality: Right;    Home Medications:  Allergies as of 02/08/2018   No  Known Allergies     Medication List        Accurate as of 02/08/18  9:30 AM. Always use your most recent med list.          acetaminophen 325 MG tablet Commonly known as:  TYLENOL Take 2 tablets (650 mg total) by mouth every 6 (six) hours as needed for mild pain (or Fever >/= 101).   atenolol 25 MG tablet Commonly known as:  TENORMIN Take 25 mg by mouth daily.   omeprazole 20 MG tablet Commonly known as:  PRILOSEC OTC Take 20 mg by mouth daily.   temazepam 15 MG capsule Commonly known as:  RESTORIL Take 15 mg by mouth at bedtime as needed for sleep.   vitamin E 400 UNIT capsule Take 400 Units by mouth daily.       Allergies: No Known Allergies  Family History: No family history on file.  Social History:  reports that he quit smoking about 50 years ago. His smoking use included cigarettes. He has a 2.50 pack-year smoking history. He quit smokeless tobacco use about 50 years ago.  His smokeless tobacco use included chew. He reports that he drank alcohol. He reports that he does not use drugs.  ROS: UROLOGY Frequent Urination?: No Hard to postpone urination?: No Burning/pain with urination?: No Get up at night to urinate?: No Leakage of urine?: No Urine stream starts and stops?: No Trouble starting stream?: No Do you have to strain to urinate?: No Blood in urine?: No Urinary tract infection?: No Sexually transmitted disease?: No Injury  to kidneys or bladder?: No Painful intercourse?: No Weak stream?: No Erection problems?: No Penile pain?: No  Gastrointestinal Nausea?: No Vomiting?: No Indigestion/heartburn?: No Diarrhea?: No Constipation?: No  Constitutional Fever: No Night sweats?: No Weight loss?: No Fatigue?: No  Skin Skin rash/lesions?: No Itching?: No  Eyes Blurred vision?: No Double vision?: No  Ears/Nose/Throat Sore throat?: No Sinus problems?: No  Hematologic/Lymphatic Swollen glands?: No Easy bruising?:  No  Cardiovascular Leg swelling?: No Chest pain?: No  Respiratory Cough?: No Shortness of breath?: No  Endocrine Excessive thirst?: No  Musculoskeletal Back pain?: No Joint pain?: No  Neurological Headaches?: No Dizziness?: No  Psychologic Depression?: No Anxiety?: No  Physical Exam: BP (!) 151/85   Pulse (!) 55   Ht 6' (1.829 m)   Wt 200 lb (90.7 kg)   BMI 27.12 kg/m   Constitutional:  Alert and oriented, No acute distress. HEENT: [place] AT, moist mucus membranes.  Trachea midline, no masses. Cardiovascular: No clubbing, cyanosis, or edema. Respiratory: Normal respiratory effort, no increased work of breathing. GU: No CVA tenderness Skin: No rashes, bruises or suspicious lesions. Neurologic: Grossly intact, no focal deficits, moving all 4 extremities. Psychiatric: Normal mood and affect.   Pertinent Imaging: CLINICAL DATA:  Right renal stone.  EXAM: RENAL / URINARY TRACT ULTRASOUND COMPLETE  COMPARISON:  None.  FINDINGS: Right Kidney:  Renal measurements: 12.2 x 6.0 x 4.2 cm = volume: 160 mL . Echogenicity within normal limits. No mass or hydronephrosis visualized.  Left Kidney:  Renal measurements: 12.9 x 4.6 x 5.5 cm = volume: 172.3 mL. 4.3 mm nonobstructing stone in the midpole.  Bladder:  Poorly distended and poorly evaluated.  IMPRESSION: 1. Nonobstructive 4.3 mm stone in the left kidney. No right-sided stones noted.   Electronically Signed   By: David  Williams III M.D   On: 02/02/2018 00:24  US Renal personally reviewed today and with the patiet.   Assessment & Plan:    1. Right ureteral stone  -Uncomplicated, successful ureteroscopy  -No residual hydronephrosis  -Stone composition reviewed -We discussed general stone prevention techniques including drinking plenty water with goal of producing 2.5 L urine daily, increased citric acid intake, avoidance of high oxalate containing foods, and decreased salt intake.   Information about dietary recommendations given today.   2. Left Real Calculus  -non symptomatic, 76mOrinda KBr6[facility] HospitalDane75mlOrinda KO68[facility] Surgery CenterDane66mlOrinda KGlenn90[facility] Hospital DistrictDane35mlOrinda KA63[facility] Medical CenterDane4mlOrinda KColl30Chi Health PlainviewDane21mlOrinda KS60[facility] Rehab InstDane43mlOrinda KL10[facility]ong[facility] HospitalDane14mlOrinda 31[facility] Medical CenterDane64mlOrinda81[facility] Surgery CtrDane50mlOrinda KB3[facility] HospitalDane68mlOrinda36[facility] IncDane53mlOrind28Advent[facility] Medical Center-SelmaDane18mlOrinda KWatert80Or[facility] Surgery Center LLCDane88mlOrinda KHoust75Atrium Medical CenterDane67mlOrinda KBr4[facility] Memorial HospitalDane48mlOrinda52[facility] Medical CenterDanelle Earthlyecommonds surveillance  -Follow-up with [facility] in 1 year   Return in about 1 year (around 02/09/2019) for [facility].  Peaches Vanoverbeke, MD  [facility] [address], [address] ,  [address] [phone]  I, Nethusan Sivanesan, am acting as a scribe for Dr. Kellen Dutch,  I have reviewed the above documentation for accuracy and completeness, and I agree with the above.   Shaletta Hinostroza, MD

## 2018-02-08 ENCOUNTER — Encounter: Payer: Self-pay | Admitting: Urology

## 2018-02-08 ENCOUNTER — Ambulatory Visit (INDEPENDENT_AMBULATORY_CARE_PROVIDER_SITE_OTHER): Payer: Medicare Other | Admitting: Urology

## 2018-02-08 VITALS — BP 151/85 | HR 55 | Ht 72.0 in | Wt 200.0 lb

## 2018-02-08 DIAGNOSIS — N200 Calculus of kidney: Secondary | ICD-10-CM | POA: Diagnosis not present

## 2018-02-08 DIAGNOSIS — N201 Calculus of ureter: Secondary | ICD-10-CM | POA: Diagnosis not present

## 2019-02-12 ENCOUNTER — Ambulatory Visit
Admission: RE | Admit: 2019-02-12 | Discharge: 2019-02-12 | Disposition: A | Discharge status: Home / Self Care | Payer: Medicare Other | Source: Ambulatory Visit | Attending: Urology | Admitting: Urology

## 2019-02-12 ENCOUNTER — Encounter: Payer: Self-pay | Admitting: Urology

## 2019-02-12 ENCOUNTER — Other Ambulatory Visit: Payer: Self-pay

## 2019-02-12 ENCOUNTER — Ambulatory Visit: Payer: Medicare Other | Admitting: Urology

## 2019-02-12 VITALS — BP 179/77 | HR 59 | Ht 72.0 in | Wt 199.0 lb

## 2019-02-12 DIAGNOSIS — N200 Calculus of kidney: Secondary | ICD-10-CM | POA: Insufficient documentation

## 2019-02-12 DIAGNOSIS — Z87442 Personal history of urinary calculi: Secondary | ICD-10-CM

## 2019-02-12 DIAGNOSIS — R109 Unspecified abdominal pain: Secondary | ICD-10-CM | POA: Diagnosis not present

## 2019-02-12 DIAGNOSIS — N201 Calculus of ureter: Secondary | ICD-10-CM | POA: Diagnosis not present

## 2019-02-12 NOTE — Progress Notes (Signed)
02/12/2019 10:32 AM   Dustin Reese [birth year] [id number]  Referring provider: [facility] [address] [place],  [place] [address]  Chief Complaint  Patient presents with  . Follow-up    HPI: 73 year old male who returns today for routine annual follow-up for a personal history of kidney stones with KUB  He underwent emergent right ureteroscopy last year for an obstructing stone at which time his nonobstructing stones were also treated.  Follow-up renal ultrasound showed resolution of his hydronephrosis.  Stone composition primarily calcium oxalate monohydrate.  He did have a nonobstructing 4 mm left stone.  Over the past year, he said no stone episodes of which he is aware.  No left flank pain.  He does report that ever since his ureteroscopy, he has had a dull ache in his right kidney which is worse with movement and improves with rest.  If he "mashes" on the area it hurts worse.  It is right above his right iliac bone.  It is alleviated by rest and massage.  No significant urinary symptoms.  He has been drinking lots of water this year, he started adding lemon lime to his diet.   PMH: Past Medical History:  Diagnosis Date  . GERD (gastroesophageal reflux disease)   . History of kidney stones   . HTN (hypertension)     Surgical History: Past Surgical History:  Procedure Laterality Date  . BACK SURGERY    . CARPAL TUNNEL RELEASE Bilateral   . CYSTOSCOPY/URETEROSCOPY/HOLMIUM LASER/STENT PLACEMENT Right 01/10/2018   Procedure: CYSTOSCOPY/URETEROSCOPY/HOLMIUM LASER/STENT Exchange;  Surgeon: Vanna Scotland, MD;  Location: [facility];  Service: Urology;  Laterality: Right;  . EYE SURGERY     Cataract  . TONSILLECTOMY    . URETEROSCOPY WITH HOLMIUM LASER LITHOTRIPSY Right 12/23/2017   Procedure: Right URETEROSCOPY, right retrograde pylogram, right ureteral stent;  Surgeon: Jerilee Field, MD;  Location: [facility];  Service: Urology;  Laterality:  Right;    Home Medications:  Allergies as of 02/12/2019   No Known Allergies     Medication List       Accurate as of February 12, 2019 10:32 AM. If you have any questions, ask your nurse or doctor.        acetaminophen 325 MG tablet Commonly known as: TYLENOL Take 2 tablets (650 mg total) by mouth every 6 (six) hours as needed for mild pain (or Fever >/= 101).   atenolol 25 MG tablet Commonly known as: TENORMIN Take 25 mg by mouth daily.   omeprazole 20 MG tablet Commonly known as: PRILOSEC OTC Take 20 mg by mouth daily.   temazepam 30 MG capsule Commonly known as: RESTORIL Take 30 mg by mouth at bedtime as needed. What changed: Another medication with the same name was removed. Continue taking this medication, and follow the directions you see here. Changed by: Vanna Scotland, MD   vitamin E 400 UNIT capsule Take 400 Units by mouth daily.       Allergies: No Known Allergies  Family History: No family history on file.  Social History:  reports that he quit smoking about 51 years ago. His smoking use included cigarettes. He has a 2.50 pack-year smoking history. He quit smokeless tobacco use about 51 years ago.  His smokeless tobacco use included chew. He reports previous alcohol use. He reports that he does not use drugs.  ROS: UROLOGY Frequent Urination?: No Hard to postpone urination?: No Burning/pain with urination?: No Get up at night to urinate?:  No Leakage of urine?: No Urine stream starts and stops?: No Trouble starting stream?: No Do you have to strain to urinate?: No Blood in urine?: No Urinary tract infection?: No Sexually transmitted disease?: No Injury to kidneys or bladder?: No Painful intercourse?: No Weak stream?: No Erection problems?: No Penile pain?: No  Gastrointestinal Nausea?: No Vomiting?: No Indigestion/heartburn?: No Diarrhea?: No Constipation?: No  Constitutional Fever: No Night sweats?: No Weight loss?: No Fatigue?:  No  Skin Skin rash/lesions?: No Itching?: No  Eyes Blurred vision?: No Double vision?: No  Ears/Nose/Throat Sore throat?: No Sinus problems?: No  Hematologic/Lymphatic Swollen glands?: No Easy bruising?: No  Cardiovascular Leg swelling?: No Chest pain?: No  Respiratory Cough?: No Shortness of breath?: No  Endocrine Excessive thirst?: No  Musculoskeletal Back pain?: Yes(Right flank pain) Joint pain?: No  Neurological Headaches?: No Dizziness?: No  Psychologic Depression?: No Anxiety?: No  Physical Exam: BP (!) 179/77   Pulse (!) 59   Ht 6' (1.829 m)   Wt 199 lb (90.3 kg)   BMI 26.99 kg/m   Constitutional:  Alert and oriented, No acute distress. HEENT: [place] AT, moist mucus membranes.  Trachea midline, no masses. Cardiovascular: No clubbing, cyanosis, or edema. Respiratory: Normal respiratory effort, no increased work of breathing. GU: No CVA tenderness.  Patient points to a tender area over right iliac crest. Skin: No rashes, bruises or suspicious lesions. Neurologic: Grossly intact, no focal deficits, moving all 4 extremities. Psychiatric: Normal mood and affect.  Laboratory Data: Lab Results  Component Value Date   WBC 12.6 (H) 01/05/2018   HGB 14.1 01/05/2018   HCT 41.5 01/05/2018   MCV 89.4 01/05/2018   PLT 174 01/05/2018    Lab Results  Component Value Date   CREATININE 1.26 (H) 01/05/2018     Pertinent Imaging: KUB today was personally reviewed and compared to previous CT scan from 2019.  The 4 mm left kidney stone is not easily visualized today secondary to overlying bowel gas.  No obvious stones appreciated.  Radiologic interpretation is pending.  Assessment & Plan:    1. Renal calculus, left Appreciated today on KUB Otherwise asymptomatic on the left side Given the small size of the stone previously and asymptomatic nature not easily visualized on KUB, would recommend following up clinically for this if he has signs or symptoms  of a stone Reminded of stone diet, encouraged to continue to drink plenty water as he has been doing  2. History of kidney stones As above  3. Right flank pain Based on the nature of the patient's pain, I have very low suspicion that his chronic right flank pain is related to stones, much more likely musculoskeletal KUB is reassuring   F/u prn   Hollice Espy, MD  [place] [address] [place], [place] [address] [phone]

## 2020-08-14 IMAGING — CR DG ABDOMEN 1V
1 series · 2 of 2 positions shown · non-contrast
Comparison: CT 12/22/2017

CLINICAL DATA: Right ureteral stone

EXAM:
ABDOMEN - 1 VIEW

[Series 1: dg abd 1 view · 0.14mm/px · 2 of 2 slices shown]
[im 1/2]
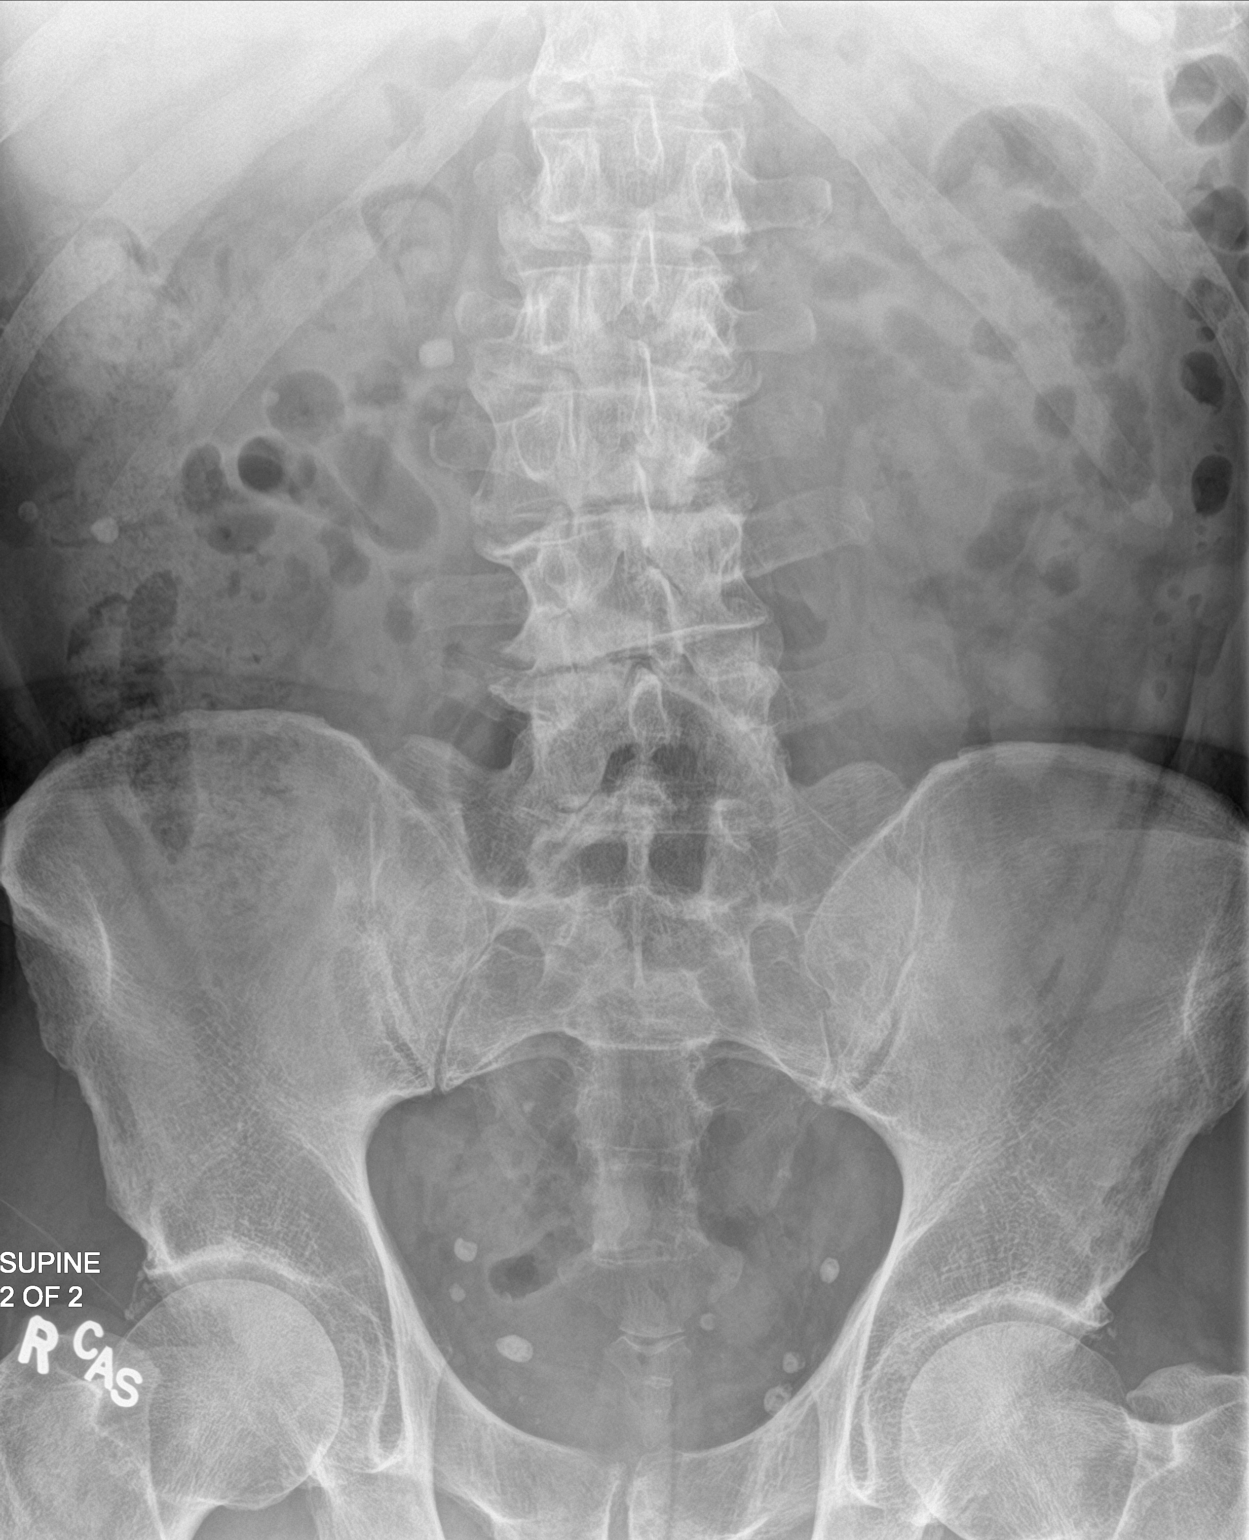
[im 2/2]
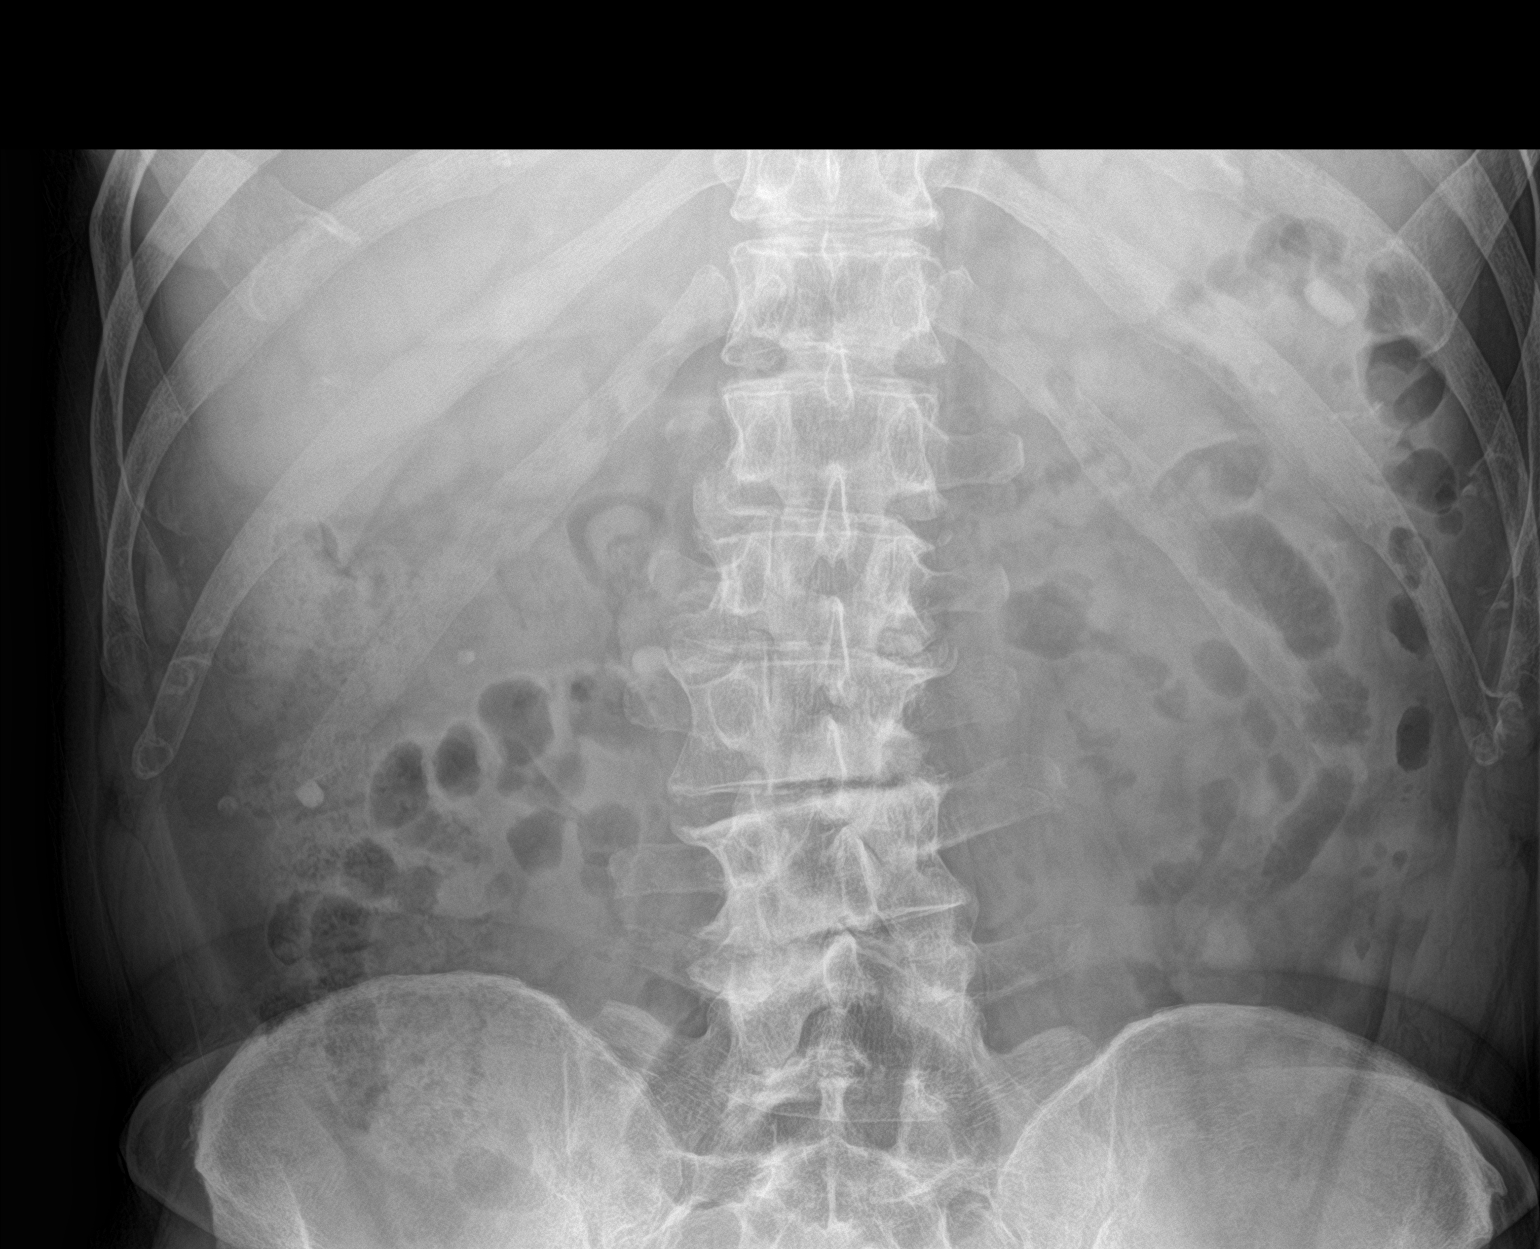

[2 of 2 positions shown; findings below may reference images not displayed]

FINDINGS: Right lower pole renal stone again noted. Previously seen small mid
right ureteral stone appears to have migrated distally, likely in
the distal right ureter overlying the right side of the sacrum.
Calcified phleboliths in the pelvis. Nonobstructive bowel gas
pattern.
IMPRESSION: Probable distal migration of the right ureteral stone now in the
distal right ureter overlying the right side of the sacrum.

Right lower pole nephrolithiasis

## 2020-08-27 IMAGING — CT CT ANGIO CHEST
2 of 6 series · 18 of 36 positions shown · IV contrast (APPLIED)
Comparison: Chest radiographs 01/05/2018

CLINICAL DATA: Left-sided chest pain for 3 days. Pleurisy.

EXAM:
CT ANGIOGRAPHY CHEST WITH CONTRAST
TECHNIQUE: Multidetector CT imaging of the chest was performed using the
standard protocol during bolus administration of intravenous
contrast. Multiplanar CT image reconstructions and MIPs were
obtained to evaluate the vascular anatomy.
CONTRAST:  75mL AT79GN-Q50 IOPAMIDOL (AT79GN-Q50) INJECTION 76%

[Series 5: thins · axial · 0.75mm/px · z∈[-297,-40]mm · 17 of 287 slices shown]
[im 15/287  lung]
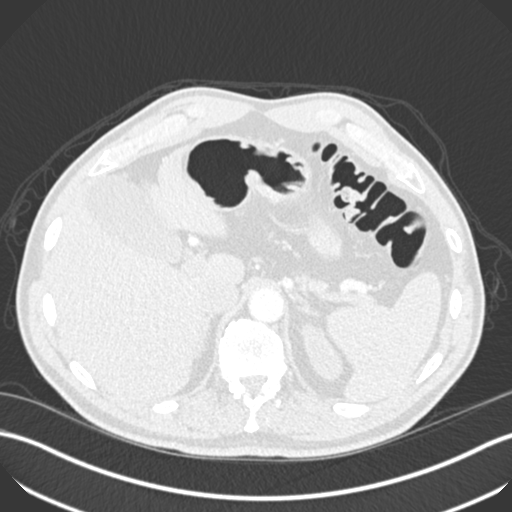
[im 29/287  mediastinal]
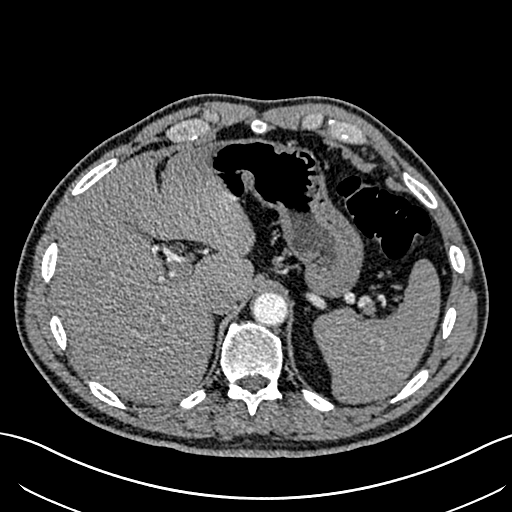
[im 43/287  lung]
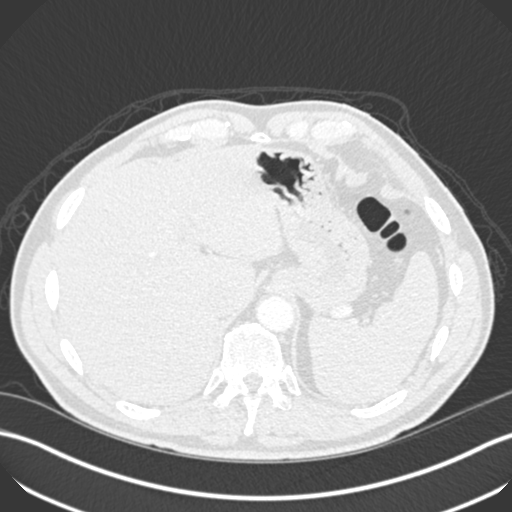
[im 58/287  mediastinal]
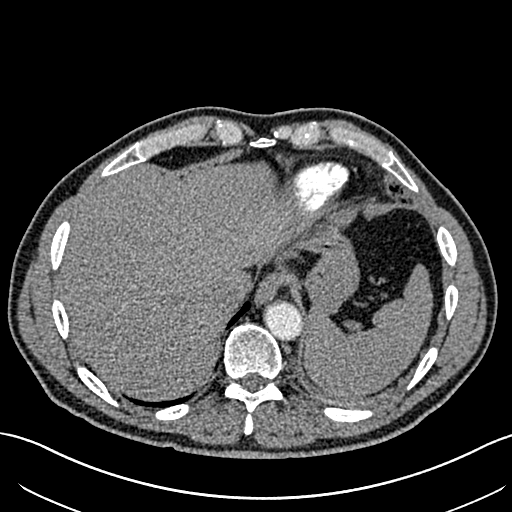
[im 86/287  lung]
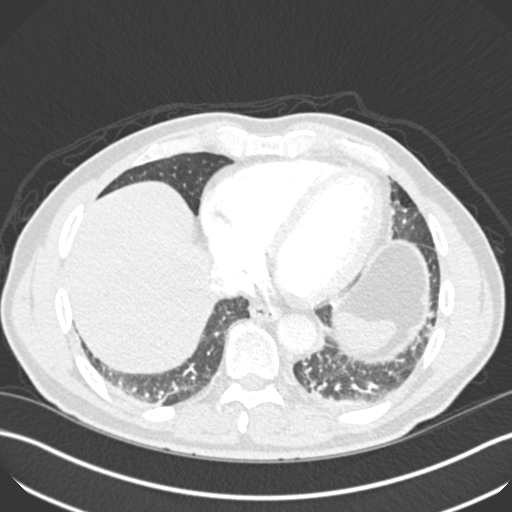
[im 101/287  mediastinal]
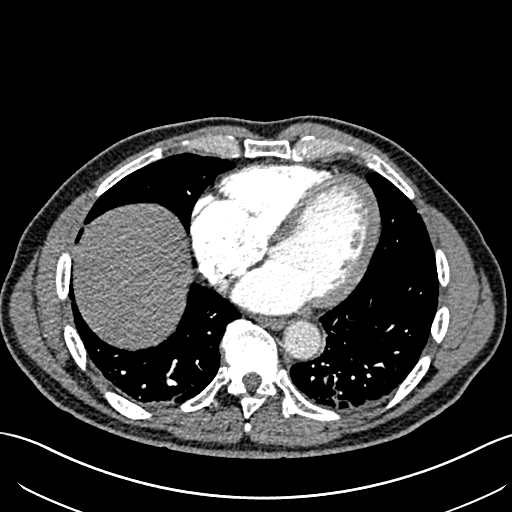
[im 115/287  lung]
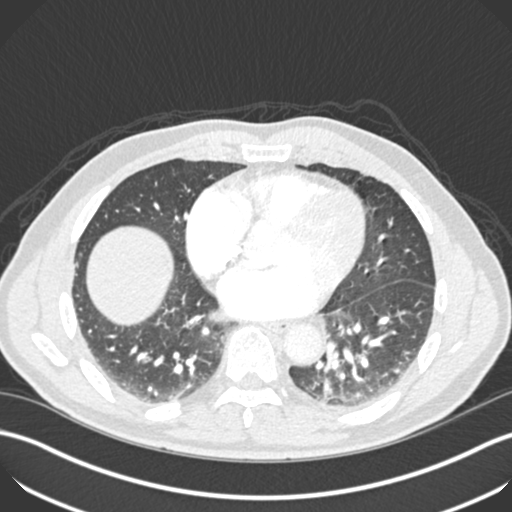
[im 129/287  mediastinal]
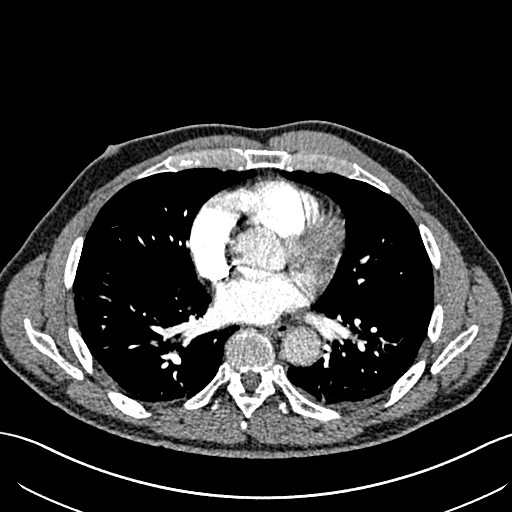
[im 144/287  lung]
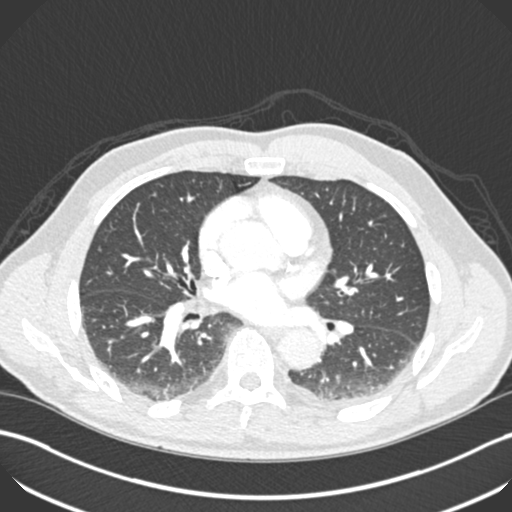
[im 158/287  mediastinal]
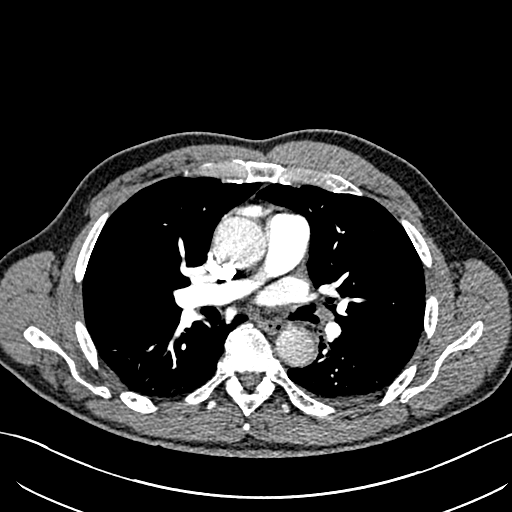
[im 172/287  lung]
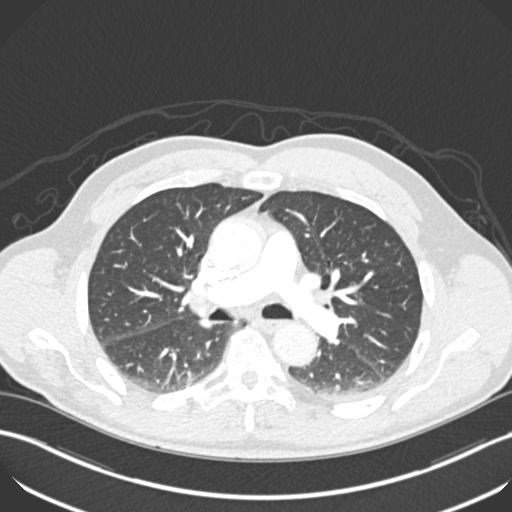
[im 186/287  mediastinal]
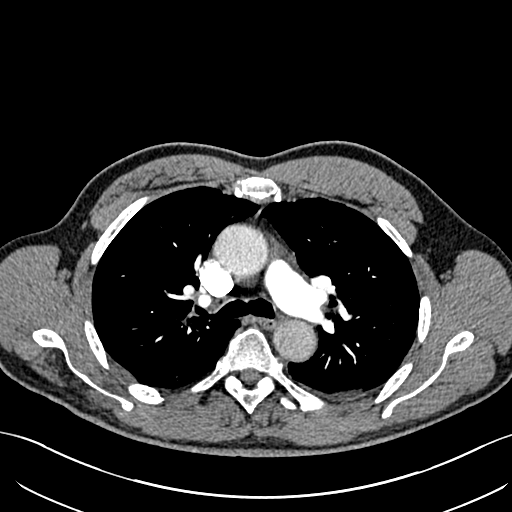
[im 201/287  lung]
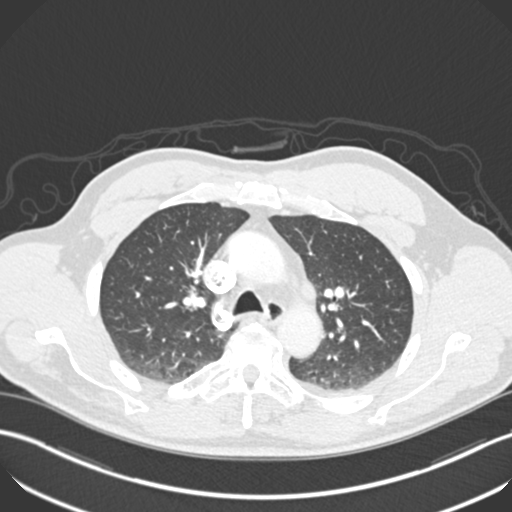
[im 229/287  mediastinal]
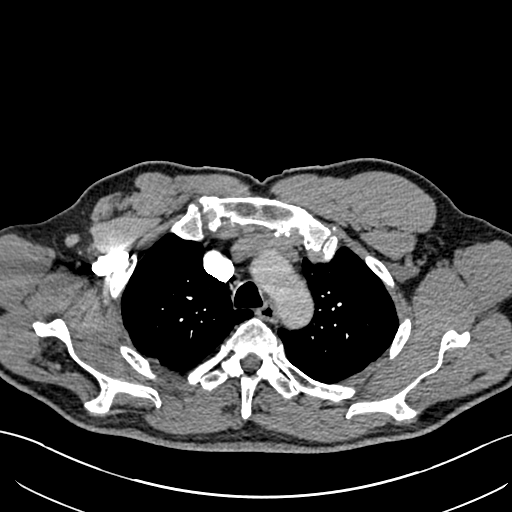
[im 244/287  lung]
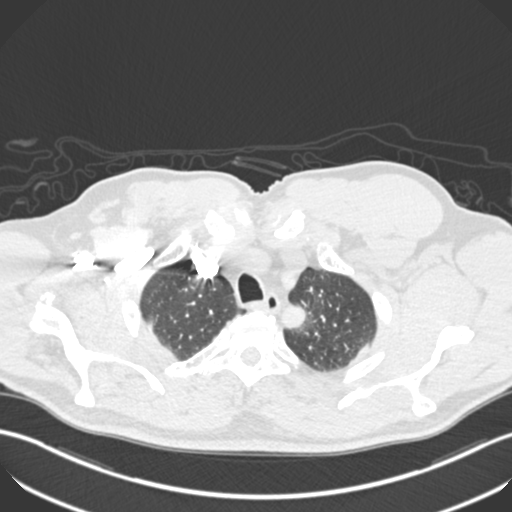
[im 258/287  mediastinal]
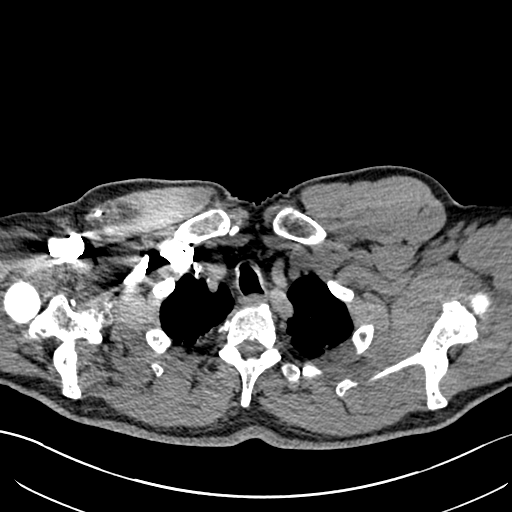
[im 272/287  lung]
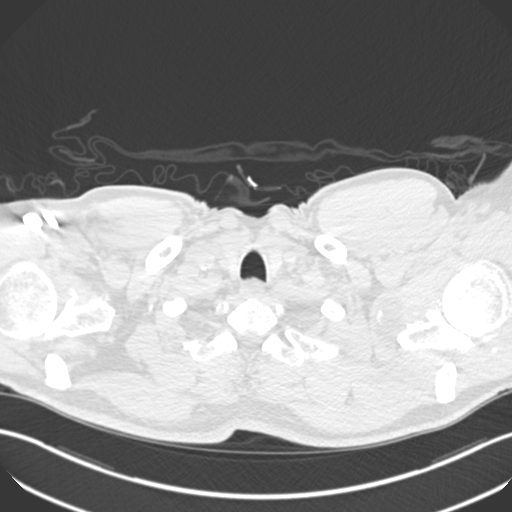

[Series 7: coronal mpr · coronal · 0.56mm/px · 1 of 94 slices shown]
[im 47/94  mediastinal]
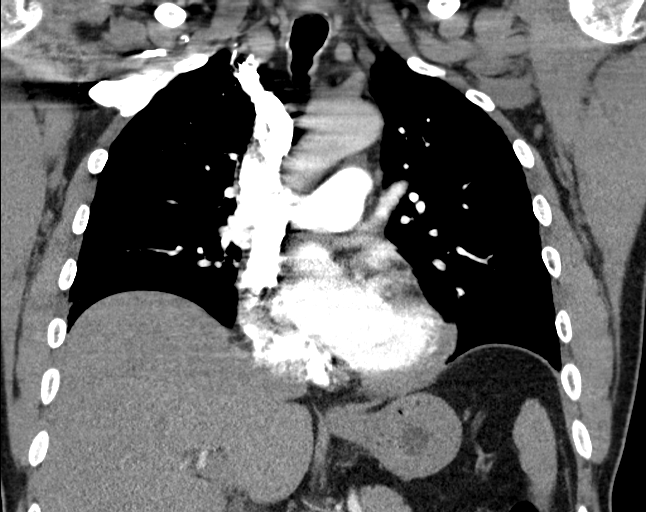

[18 of 36 positions shown; findings below may reference images not displayed]

FINDINGS: Cardiovascular: Pulmonary arterial opacification is adequate without
evidence of emboli. The thoracic aorta is normal in caliber. The
heart is borderline enlarged. There is no pericardial effusion.

Mediastinum/Nodes: No enlarged axillary, mediastinal, or hilar lymph
nodes. Unremarkable esophagus and included thyroid.

Lungs/Pleura: Trace left pleural effusion. No pneumothorax. 3 mm
nodule along the right minor fissure (series 6, image 45). 5 mm
calcified right middle lobe nodule. 3 mm noncalcified right middle
lobe nodule (series 6, image 56). Mild respiratory motion artifact
most notable in the lung bases with mild dependent subpleural
opacity in both lower lobes most compatible with atelectasis. More
focal opacity inferiorly in the lingula.

Upper Abdomen: Mildly hydropic gallbladder, incompletely imaged
without gross wall thickening or visualized pericholecystic
inflammation.

Musculoskeletal: No acute osseous abnormality or suspicious osseous
lesion.

Review of the MIP images confirms the above findings.
IMPRESSION: 1. No evidence of pulmonary emboli.
2. Trace left pleural effusion and mild bibasilar atelectasis.
3. More focal opacity in the lingula could reflect pneumonia.
4. Small right lung nodules. No follow-up needed if patient is
low-risk (and has no known or suspected primary neoplasm).
Non-contrast chest CT can be considered in 12 months if patient is
high-risk. This recommendation follows the consensus statement:
Guidelines for Management of Incidental Pulmonary Nodules Detected
